# Patient Record
Sex: Female | Born: 1970 | Race: White | Hispanic: No | Marital: Married | Smoking: Former smoker
Health system: Southern US, Community
[De-identification: ages and names within clinical notes are randomized; demographics above are authoritative.]

## PROBLEM LIST (undated history)

## (undated) DIAGNOSIS — D649 Anemia, unspecified: Secondary | ICD-10-CM

## (undated) DIAGNOSIS — J40 Bronchitis, not specified as acute or chronic: Secondary | ICD-10-CM

## (undated) DIAGNOSIS — E039 Hypothyroidism, unspecified: Secondary | ICD-10-CM

## (undated) DIAGNOSIS — K8020 Calculus of gallbladder without cholecystitis without obstruction: Secondary | ICD-10-CM

## (undated) DIAGNOSIS — F32A Depression, unspecified: Secondary | ICD-10-CM

## (undated) DIAGNOSIS — K219 Gastro-esophageal reflux disease without esophagitis: Secondary | ICD-10-CM

## (undated) DIAGNOSIS — F329 Major depressive disorder, single episode, unspecified: Secondary | ICD-10-CM

## (undated) DIAGNOSIS — IMO0002 Reserved for concepts with insufficient information to code with codable children: Secondary | ICD-10-CM

## (undated) DIAGNOSIS — B009 Herpesviral infection, unspecified: Secondary | ICD-10-CM

## (undated) DIAGNOSIS — Z87891 Personal history of nicotine dependence: Secondary | ICD-10-CM

## (undated) DIAGNOSIS — D249 Benign neoplasm of unspecified breast: Secondary | ICD-10-CM

## (undated) DIAGNOSIS — F419 Anxiety disorder, unspecified: Secondary | ICD-10-CM

## (undated) HISTORY — DX: Anxiety disorder, unspecified: F41.9

## (undated) HISTORY — DX: Herpesviral infection, unspecified: B00.9

## (undated) HISTORY — DX: Reserved for concepts with insufficient information to code with codable children: IMO0002

## (undated) HISTORY — PX: BREAST SURGERY: SHX581

## (undated) HISTORY — DX: Depression, unspecified: F32.A

## (undated) HISTORY — DX: Calculus of gallbladder without cholecystitis without obstruction: K80.20

## (undated) HISTORY — DX: Personal history of nicotine dependence: Z87.891

## (undated) HISTORY — DX: Major depressive disorder, single episode, unspecified: F32.9

## (undated) HISTORY — PX: CHOLECYSTECTOMY: SHX55

## (undated) HISTORY — PX: DILATION AND CURETTAGE OF UTERUS: SHX78

## (undated) HISTORY — DX: Hypothyroidism, unspecified: E03.9

## (undated) HISTORY — DX: Benign neoplasm of unspecified breast: D24.9

## (undated) HISTORY — PX: INTRAUTERINE DEVICE (IUD) INSERTION: SHX5877

## (undated) HISTORY — DX: Bronchitis, not specified as acute or chronic: J40

---

## 1992-08-22 HISTORY — PX: PILONIDAL CYST EXCISION: SHX744

## 1994-08-22 HISTORY — PX: DILATION AND CURETTAGE OF UTERUS: SHX78

## 1998-02-25 ENCOUNTER — Other Ambulatory Visit: Admission: RE | Admit: 1998-02-25 | Discharge: 1998-02-25 | Payer: Self-pay | Admitting: Gynecology

## 1999-04-12 ENCOUNTER — Other Ambulatory Visit: Admission: RE | Admit: 1999-04-12 | Discharge: 1999-04-12 | Payer: Self-pay | Admitting: Gynecology

## 1999-10-26 ENCOUNTER — Other Ambulatory Visit: Admission: RE | Admit: 1999-10-26 | Discharge: 1999-10-26 | Payer: Self-pay | Admitting: Gynecology

## 2000-01-31 ENCOUNTER — Encounter (INDEPENDENT_AMBULATORY_CARE_PROVIDER_SITE_OTHER): Payer: Self-pay

## 2000-01-31 ENCOUNTER — Inpatient Hospital Stay (HOSPITAL_COMMUNITY): Admission: AD | Admit: 2000-01-31 | Discharge: 2000-02-01 | Payer: Self-pay | Admitting: Gynecology

## 2001-05-08 ENCOUNTER — Other Ambulatory Visit: Admission: RE | Admit: 2001-05-08 | Discharge: 2001-05-08 | Payer: Self-pay | Admitting: Gynecology

## 2001-08-06 ENCOUNTER — Other Ambulatory Visit: Admission: RE | Admit: 2001-08-06 | Discharge: 2001-08-06 | Payer: Self-pay | Admitting: Gynecology

## 2001-12-09 ENCOUNTER — Inpatient Hospital Stay (HOSPITAL_COMMUNITY): Admission: AD | Admit: 2001-12-09 | Discharge: 2001-12-09 | Payer: Self-pay | Admitting: Gynecology

## 2002-08-27 ENCOUNTER — Other Ambulatory Visit: Admission: RE | Admit: 2002-08-27 | Discharge: 2002-08-27 | Payer: Self-pay | Admitting: Gynecology

## 2003-01-31 ENCOUNTER — Inpatient Hospital Stay (HOSPITAL_COMMUNITY): Admission: AD | Admit: 2003-01-31 | Discharge: 2003-01-31 | Payer: Self-pay | Admitting: Gynecology

## 2003-03-01 ENCOUNTER — Inpatient Hospital Stay (HOSPITAL_COMMUNITY): Admission: AD | Admit: 2003-03-01 | Discharge: 2003-03-03 | Payer: Self-pay | Admitting: Gynecology

## 2003-03-02 ENCOUNTER — Encounter (INDEPENDENT_AMBULATORY_CARE_PROVIDER_SITE_OTHER): Payer: Self-pay | Admitting: Specialist

## 2003-04-07 ENCOUNTER — Other Ambulatory Visit: Admission: RE | Admit: 2003-04-07 | Discharge: 2003-04-07 | Payer: Self-pay | Admitting: Gynecology

## 2003-05-13 ENCOUNTER — Other Ambulatory Visit: Admission: RE | Admit: 2003-05-13 | Discharge: 2003-05-13 | Payer: Self-pay | Admitting: Pediatrics

## 2003-08-23 DIAGNOSIS — E039 Hypothyroidism, unspecified: Secondary | ICD-10-CM

## 2003-08-23 HISTORY — DX: Hypothyroidism, unspecified: E03.9

## 2003-12-05 HISTORY — PX: OTHER SURGICAL HISTORY: SHX169

## 2004-04-16 ENCOUNTER — Other Ambulatory Visit: Admission: RE | Admit: 2004-04-16 | Discharge: 2004-04-16 | Payer: Self-pay | Admitting: Gynecology

## 2005-08-22 HISTORY — PX: HYSTEROSCOPY: SHX211

## 2005-09-05 ENCOUNTER — Other Ambulatory Visit: Admission: RE | Admit: 2005-09-05 | Discharge: 2005-09-05 | Payer: Self-pay | Admitting: Gynecology

## 2006-04-02 ENCOUNTER — Emergency Department: Payer: Self-pay | Admitting: Emergency Medicine

## 2006-09-11 ENCOUNTER — Other Ambulatory Visit: Admission: RE | Admit: 2006-09-11 | Discharge: 2006-09-11 | Payer: Self-pay | Admitting: Gynecology

## 2007-11-02 ENCOUNTER — Other Ambulatory Visit: Admission: RE | Admit: 2007-11-02 | Discharge: 2007-11-02 | Payer: Self-pay | Admitting: Gynecology

## 2008-01-17 ENCOUNTER — Ambulatory Visit: Payer: Self-pay | Admitting: General Surgery

## 2008-01-24 ENCOUNTER — Ambulatory Visit: Payer: Self-pay | Admitting: General Surgery

## 2009-01-09 ENCOUNTER — Other Ambulatory Visit: Admission: RE | Admit: 2009-01-09 | Discharge: 2009-01-09 | Payer: Self-pay | Admitting: Gynecology

## 2009-01-09 ENCOUNTER — Ambulatory Visit: Payer: Self-pay | Admitting: Gynecology

## 2009-01-09 ENCOUNTER — Encounter: Payer: Self-pay | Admitting: Gynecology

## 2009-08-22 HISTORY — PX: BREAST EXCISIONAL BIOPSY: SUR124

## 2010-01-15 ENCOUNTER — Ambulatory Visit: Payer: Self-pay | Admitting: Gynecology

## 2010-01-15 ENCOUNTER — Other Ambulatory Visit: Admission: RE | Admit: 2010-01-15 | Discharge: 2010-01-15 | Payer: Self-pay | Admitting: Gynecology

## 2011-01-07 NOTE — H&P (Signed)
NAME:  Amy Peters, Amy Peters                       ACCOUNT NO.:  [id number]   MEDICAL RECORD NO.:  [id number]                   PATIENT TYPE:  INP   LOCATION:  9165                                 FACILITY:  [facility]   PHYSICIAN:  Juan H. Lily Peer, M.D.             DATE OF BIRTH:  07-08-1971   DATE OF ADMISSION:  03/01/2003  DATE OF DISCHARGE:                                HISTORY & PHYSICAL   CHIEF COMPLAINT:  Preterm premature rupture of membranes.   HISTORY:  The patient is a 40 year old gravida 4 para 1 AB 2, currently 19  and five-sevenths weeks gestation with an estimated date of confinement of  April 01, 2003.  The patient was seen at approximately 0330 this morning  after she came back from the bathroom and laid in bed then she had a gush of  fluid which was clear.  She also had some mild contractions, presented to  [facility], and was found to have contractions every three to five  minutes apart with a reassuring fetal heart rate tracing and she was  approximately 4 cm dilated, 90% effaced, and -2 station.  Her vital signs on  admission were blood pressure 126/76, temperature 98.3, pulse 72,  respirations 14.  Prenatal course significant for the fact that early in  this pregnancy on screening ultrasound there appeared to be a velamentous-  type placenta and the patient subsequently had follow-up ultrasound at  [facility] and did not feel that there would be any contraindication  of vaginal delivery, was very mild and away from the cervical os, and that  she could potentially deliver vaginally.  She had a history of an  intrauterine fetal demise at 23 weeks in the past.  She had a negative  workup and has been followed with weekly stress tests and ultrasounds for  antepartum testing.  In the third trimester she began to experience preterm  labor and had been taking terbutaline on a p.r.n. basis because of the  infrequent nature of her contractions.  She stated  yesterday in the  afternoon was the last dose of p.o. terbutaline that she took.   PAST MEDICAL HISTORY:  In 1996 she had a spontaneous AB of eight to ten  weeks gestation.  In 1998 a normal spontaneous vaginal delivery, 12-hour  labor, 8 pounds 9 ounces at 39 weeks.  In 2001 a fetal demise and delivered  vaginally at [redacted] weeks gestation.  The patient had a GBS culture back in June  2004 which had been negative.  She had a history of D&C in 1996, pilonidal  cyst in 1994.   REVIEW OF SYSTEMS:  See [place] form.   PHYSICAL EXAMINATION:  GENERAL:  Well-developed, well-nourished female.  HEENT:  Unremarkable.  NECK:  Supple, trachea midline.  No carotid bruits, no thyromegaly.  LUNGS:  Clear to auscultation without rhonchi or wheezes.  HEART:  Regular rate and rhythm.  No  murmurs or gallops.  BREAST:  Not done.  ABDOMEN:  Gravid uterus.  Fundal height approximately 35 cm, vertex  presentation by Thayer Ohm maneuver.  PELVIC:  Cervix now 7 cm, 90% effaced, -1 to 0 station.  Clear amniotic  fluid.  No abnormality palpated on digital exam.  EXTREMITIES:  DTR 1+, trace edema.   PRENATAL LABORATORY DATA:  O positive blood type, negative antibody screen.  VDRL was nonreactive.  Rubella immune.  Hepatitis B surface antigen and HIV  were negative.  Alpha-fetoprotein was normal.  Diabetes screen was normal.  Her admission hemoglobin and hematocrit were 11.7 and 35.4 respectively with  a platelet count of 157,000.   ASSESSMENT:  A 40 year old gravida 4 para 1 AB 2 at 35-and-a-half weeks  estimated gestational age with preterm premature rupture of membranes.  Early pregnancy ultrasound had demonstrated questionable velamentous cord  insertion.  Follow-up at [facility] did not recommend any  contraindication of vaginal delivery.  It appeared that the velamentous cord  insertion was away from the internal cervical os.  She had clear rupture of  membranes approximately 0330 this a.m.  Scalp  electrode was placed but will  hold off on putting an IUPC due to concerns of the presentation.  The  patient has had a reassuring fetal heart rate tracing, had an epidural  placed, and her contractions were every five to eight minutes apart.  Will  augment with Pitocin.  Fetal heart rate tracing is reassuring.  Anticipate a  vaginal delivery shortly.  Due to the fact that she is 35-and-a-half weeks  estimated gestational age despite having a negative GBS culture last month  she will receive Pen-G for prophylaxis.   PLAN:  As per assessment above.                                               Juan H. Lily Peer, M.D.    [facility]  D:  03/01/2003  T:  03/01/2003  Job:  [id number]

## 2011-01-07 NOTE — Discharge Summary (Signed)
   NAME:  Amy Peters, Amy Peters                       ACCOUNT NO.:  [id number]   MEDICAL RECORD NO.:  [id number]                   PATIENT TYPE:  INP   LOCATION:  9109                                 FACILITY:  [facility]   PHYSICIAN:  Juan H. Lily Peer, M.D.             DATE OF BIRTH:  1970-10-19   DATE OF ADMISSION:  03/01/2003  DATE OF DISCHARGE:  03/03/2003                                 DISCHARGE SUMMARY   DISCHARGE DIAGNOSES:  Intrauterine pregnancy at 35-5/7 weeks.   PROCEDURE:  Vacuum assisted delivery with a second degree perineal  laceration;  viable infant.   HISTORY OF PRESENT ILLNESS:  This is a 40 year old, gravida 4, para 1, AB2  currently 35-5/7 weeks with [facility] April 01, 2003, with LMP June 19, 2002.  The patient presented with a complaint of a gush of fluid that was clear,  had some contractions, and was found to have contractions every 3-5  minutes  with reassuring fetal heart tones.  She was 4 cm dilated, 90%, -2 station.   During her pregnancy she was found to have a velamentous type insertion of  the cord and did have followup ultrasounds at [facility] and they felt  there would be no contraindications for a vaginal delivery.   LABORATORY DATA:  Blood type is O+, negative antibody, VDRL nonreactive,  Rubella immune, hepatitis B negative, HIV nonreactive.   HOSPITAL COURSE:  The patient presented on March 01, 2003, in labor.  She  progressed to complete.  She had a vacuum assisted delivery with a second  degree perineal laceration.  Delivered a viable female with APGARS 8/9 with  a birth weight of 7 pounds 9 ounces.  Postpartum she remained afebrile,  voiding, and did well.  She was discharged home on her second postpartum day  in satisfactory condition.   DISCHARGE LABORATORY DATA:  White count 8.6, hemoglobin 10.5, hematocrit  31.4, platelets 136,000.   DISPOSITION:  She was discharged to home with instructions to follow up in  six weeks or as needed.   Continue prenatal vitamins and iron.  Motrin as  needed for pain.  GGA discharge booklet.     Davonna Belling. Young, N.P.                      Lars Mage H. Lily Peer, M.D.    [facility]  D:  03/21/2003  T:  03/21/2003  Job:  [id number]

## 2011-02-18 ENCOUNTER — Encounter (INDEPENDENT_AMBULATORY_CARE_PROVIDER_SITE_OTHER): Payer: BC Managed Care – PPO | Admitting: Gynecology

## 2011-02-18 ENCOUNTER — Other Ambulatory Visit (HOSPITAL_COMMUNITY)
Admission: RE | Admit: 2011-02-18 | Discharge: 2011-02-18 | Disposition: A | Discharge status: Home / Self Care | Payer: BC Managed Care – PPO | Source: Ambulatory Visit | Attending: Gynecology | Admitting: Gynecology

## 2011-02-18 ENCOUNTER — Other Ambulatory Visit: Payer: Self-pay | Admitting: Gynecology

## 2011-02-18 DIAGNOSIS — E039 Hypothyroidism, unspecified: Secondary | ICD-10-CM

## 2011-02-18 DIAGNOSIS — Z01419 Encounter for gynecological examination (general) (routine) without abnormal findings: Secondary | ICD-10-CM

## 2011-02-18 DIAGNOSIS — Z13220 Encounter for screening for lipoid disorders: Secondary | ICD-10-CM

## 2011-02-18 DIAGNOSIS — R82998 Other abnormal findings in urine: Secondary | ICD-10-CM

## 2011-02-18 DIAGNOSIS — Z124 Encounter for screening for malignant neoplasm of cervix: Secondary | ICD-10-CM | POA: Insufficient documentation

## 2011-02-18 DIAGNOSIS — Z833 Family history of diabetes mellitus: Secondary | ICD-10-CM

## 2011-05-30 ENCOUNTER — Emergency Department: Payer: Self-pay | Admitting: Emergency Medicine

## 2011-11-08 ENCOUNTER — Telehealth: Payer: Self-pay | Admitting: *Deleted

## 2011-11-08 ENCOUNTER — Other Ambulatory Visit: Payer: Self-pay | Admitting: *Deleted

## 2011-11-08 DIAGNOSIS — Z3049 Encounter for surveillance of other contraceptives: Secondary | ICD-10-CM

## 2011-11-08 MED ORDER — LEVONORGESTREL 20 MCG/24HR IU IUD: INTRAUTERINE_SYSTEM | Freq: Once | INTRAUTERINE | Status: DC

## 2011-11-08 NOTE — Telephone Encounter (Signed)
Message copied by Libby Maw on Tue Nov 08, 2011  9:11 AM ------      Message from: Carole Civil R      Created: Mon Nov 07, 2011 12:37 PM       Arthelia Callicott-This patient needs her Mirena IUD replaced. I made an appt for her for 11/25/11. She has [employer] primary and [employer] 2nd. Copies of both cards are in the system. You can call her with the information using the highlighted number in the system. I did verify it with her at the time of the call. Thanks, Toniann Fail

## 2011-11-08 NOTE — Telephone Encounter (Signed)
Patient informed will have a $20 copay for Mirena remove/insert.

## 2011-11-21 HISTORY — PX: INTRAUTERINE DEVICE INSERTION: SHX323

## 2011-11-25 ENCOUNTER — Ambulatory Visit: Payer: BC Managed Care – PPO | Admitting: Gynecology

## 2011-11-28 ENCOUNTER — Ambulatory Visit: Payer: BC Managed Care – PPO | Admitting: Gynecology

## 2011-12-09 ENCOUNTER — Ambulatory Visit (INDEPENDENT_AMBULATORY_CARE_PROVIDER_SITE_OTHER): Payer: BC Managed Care – PPO | Admitting: Gynecology

## 2011-12-09 ENCOUNTER — Other Ambulatory Visit: Payer: Self-pay | Admitting: Gynecology

## 2011-12-09 ENCOUNTER — Encounter: Payer: Self-pay | Admitting: Gynecology

## 2011-12-09 DIAGNOSIS — Z30430 Encounter for insertion of intrauterine contraceptive device: Secondary | ICD-10-CM

## 2011-12-09 DIAGNOSIS — Z3049 Encounter for surveillance of other contraceptives: Secondary | ICD-10-CM

## 2011-12-09 DIAGNOSIS — Z30433 Encounter for removal and reinsertion of intrauterine contraceptive device: Secondary | ICD-10-CM

## 2011-12-09 NOTE — Patient Instructions (Signed)
Intrauterine Device Information An intrauterine device (IUD) is inserted into your uterus and prevents pregnancy. There are 2 types of IUDs available:  Copper IUD. This type of IUD is wrapped in copper wire and is placed inside the uterus. Copper makes the uterus and fallopian tubes produce a fluid that kills sperm. The copper IUD can stay in place for 10 years.   Hormone IUD. This type of IUD contains the hormone progestin (synthetic progesterone). The hormone thickens the cervical mucus and prevents sperm from entering the uterus, and it also thins the uterine lining to prevent implantation of a fertilized egg. The hormone can weaken or kill the sperm that get into the uterus. The hormone IUD can stay in place for 5 years.  Your caregiver will make sure you are a good candidate for a contraceptive IUD. Discuss with your caregiver the possible side effects. ADVANTAGES  It is highly effective, reversible, long-acting, and low maintenance.   There are no estrogen-related side effects.   An IUD can be used when breastfeeding.   It is not associated with weight gain.   It works immediately after insertion.   The copper IUD does not interfere with your female hormones.   The progesterone IUD can make heavy menstrual periods lighter.   The progesterone IUD can be used for 5 years.   The copper IUD can be used for 10 years.  DISADVANTAGES  The progesterone IUD can be associated with irregular bleeding patterns.   The copper IUD can make your menstrual flow heavier and more painful.   You may experience cramping and vaginal bleeding after insertion.  Document Released: 07/12/2004 Document Revised: 07/28/2011 Document Reviewed: 12/11/2010 [employer] Patient Information 2012 [employer]. 

## 2011-12-09 NOTE — Progress Notes (Signed)
A patient is a 41 year old gravida 4 para 2 Ab2 who presented to the office today to remove her overdue Mirena IUD that was placed in March of 2008 and to replace it with a new one.  Patient had received literature information on the Mirena IUD. Consent form was signed. Patient fully aware that this form of contraception is 99% effective and is good for 5 years.  Exam: Abdomen soft nontender no rebound or guarding Pelvic: Bartholin urethra Skene glands Vagina: No lesion discharge Cervix: IUD string seen Uterus: Anteverted normal size shape and consistency Adnexa: No palpable masses or tenderness Rectal exam: Not done  Procedure note: The cervix was cleansed with Betadine solution. A Bozeman clamp was utilized to retrieve the IUD string and discarded the IUD. A single-tooth tenaculum was placed on the anterior cervical lip and the uterus was then sounded to 7 cm. The Mirena IUD was inserted in a sterile fashion after the appropriate measurement had been undertaken and the string was cut. The single-tooth tenaculum was removed. A small area that was oozing from the tenaculum was contained with silver nitrate. The patient was discharged home to return back to the office in one month for followup as well as her due annual exam.

## 2012-01-06 ENCOUNTER — Encounter: Payer: Self-pay | Admitting: Gynecology

## 2012-01-06 ENCOUNTER — Ambulatory Visit (INDEPENDENT_AMBULATORY_CARE_PROVIDER_SITE_OTHER): Payer: BC Managed Care – PPO | Admitting: Gynecology

## 2012-01-06 VITALS — BP 126/80 | Ht 66.0 in | Wt 191.0 lb

## 2012-01-06 DIAGNOSIS — E039 Hypothyroidism, unspecified: Secondary | ICD-10-CM

## 2012-01-06 DIAGNOSIS — Z01419 Encounter for gynecological examination (general) (routine) without abnormal findings: Secondary | ICD-10-CM

## 2012-01-06 DIAGNOSIS — R635 Abnormal weight gain: Secondary | ICD-10-CM

## 2012-01-06 LAB — CBC WITH DIFFERENTIAL/PLATELET
Basophils Absolute: 0.1 10*3/uL (ref 0.0–0.1)
Basophils Relative: 1 % (ref 0–1)
Eosinophils Relative: 5 % (ref 0–5)
Lymphocytes Relative: 23 % (ref 12–46)
MCHC: 32.7 g/dL (ref 30.0–36.0)
Neutro Abs: 4.7 10*3/uL (ref 1.7–7.7)
Platelets: 198 10*3/uL (ref 150–400)
RDW: 13.1 % (ref 11.5–15.5)
WBC: 7.3 10*3/uL (ref 4.0–10.5)

## 2012-01-06 LAB — GLUCOSE, RANDOM: Glucose, Bld: 88 mg/dL (ref 70–99)

## 2012-01-06 LAB — TSH: TSH: 3.205 u[IU]/mL (ref 0.350–4.500)

## 2012-01-06 NOTE — Patient Instructions (Signed)
Preventive Care for Adults, Female A healthy lifestyle and preventive care can promote health and wellness. Preventive health guidelines for women include the following key practices.  A routine yearly physical is a good way to check with your caregiver about your health and preventive screening. It is a chance to share any concerns and updates on your health, and to receive a thorough exam.   Visit your dentist for a routine exam and preventive care every 6 months. Brush your teeth twice a day and floss once a day. Good oral hygiene prevents tooth decay and gum disease.   The frequency of eye exams is based on your age, health, family medical history, use of contact lenses, and other factors. Follow your caregiver's recommendations for frequency of eye exams.   Eat a healthy diet. Foods like vegetables, fruits, whole grains, low-fat dairy products, and lean protein foods contain the nutrients you need without too many calories. Decrease your intake of foods high in solid fats, added sugars, and salt. Eat the right amount of calories for you.Get information about a proper diet from your caregiver, if necessary.   Regular physical exercise is one of the most important things you can do for your health. Most adults should get at least 150 minutes of moderate-intensity exercise (any activity that increases your heart rate and causes you to sweat) each week. In addition, most adults need muscle-strengthening exercises on 2 or more days a week.   Maintain a healthy weight. The body mass index (BMI) is a screening tool to identify possible weight problems. It provides an estimate of body fat based on height and weight. Your caregiver can help determine your BMI, and can help you achieve or maintain a healthy weight.For adults 20 years and older:   A BMI below 18.5 is considered underweight.   A BMI of 18.5 to 24.9 is normal.   A BMI of 25 to 29.9 is considered overweight.   A BMI of 30 and above is  considered obese.   Maintain normal blood lipids and cholesterol levels by exercising and minimizing your intake of saturated fat. Eat a balanced diet with plenty of fruit and vegetables. Blood tests for lipids and cholesterol should begin at age 20 and be repeated every 5 years. If your lipid or cholesterol levels are high, you are over 50, or you are at high risk for heart disease, you may need your cholesterol levels checked more frequently.Ongoing high lipid and cholesterol levels should be treated with medicines if diet and exercise are not effective.   If you smoke, find out from your caregiver how to quit. If you do not use tobacco, do not start.   If you are pregnant, do not drink alcohol. If you are breastfeeding, be very cautious about drinking alcohol. If you are not pregnant and choose to drink alcohol, do not exceed 1 drink per day. One drink is considered to be 12 ounces (355 mL) of beer, 5 ounces (148 mL) of wine, or 1.5 ounces (44 mL) of liquor.   Avoid use of street drugs. Do not share needles with anyone. Ask for help if you need support or instructions about stopping the use of drugs.   High blood pressure causes heart disease and increases the risk of stroke. Your blood pressure should be checked at least every 1 to 2 years. Ongoing high blood pressure should be treated with medicines if weight loss and exercise are not effective.   If you are 55 to 40   years old, ask your caregiver if you should take aspirin to prevent strokes.   Diabetes screening involves taking a blood sample to check your fasting blood sugar level. This should be done once every 3 years, after age 45, if you are within normal weight and without risk factors for diabetes. Testing should be considered at a younger age or be carried out more frequently if you are overweight and have at least 1 risk factor for diabetes.   Breast cancer screening is essential preventive care for women. You should practice "breast  self-awareness." This means understanding the normal appearance and feel of your breasts and may include breast self-examination. Any changes detected, no matter how small, should be reported to a caregiver. Women in their 20s and 30s should have a clinical breast exam (CBE) by a caregiver as part of a regular health exam every 1 to 3 years. After age 40, women should have a CBE every year. Starting at age 40, women should consider having a mammography (breast X-ray test) every year. Women who have a family history of breast cancer should talk to their caregiver about genetic screening. Women at a high risk of breast cancer should talk to their caregivers about having magnetic resonance imaging (MRI) and a mammography every year.   The Pap test is a screening test for cervical cancer. A Pap test can show cell changes on the cervix that might become cervical cancer if left untreated. A Pap test is a procedure in which cells are obtained and examined from the lower end of the uterus (cervix).   Women should have a Pap test starting at age 21.   Between ages 21 and 29, Pap tests should be repeated every 2 years.   Beginning at age 30, you should have a Pap test every 3 years as long as the past 3 Pap tests have been normal.   Some women have medical problems that increase the chance of getting cervical cancer. Talk to your caregiver about these problems. It is especially important to talk to your caregiver if a new problem develops soon after your last Pap test. In these cases, your caregiver may recommend more frequent screening and Pap tests.   The above recommendations are the same for women who have or have not gotten the vaccine for human papillomavirus (HPV).   If you had a hysterectomy for a problem that was not cancer or a condition that could lead to cancer, then you no longer need Pap tests. Even if you no longer need a Pap test, a regular exam is a good idea to make sure no other problems are  starting.   If you are between ages 65 and 70, and you have had normal Pap tests going back 10 years, you no longer need Pap tests. Even if you no longer need a Pap test, a regular exam is a good idea to make sure no other problems are starting.   If you have had past treatment for cervical cancer or a condition that could lead to cancer, you need Pap tests and screening for cancer for at least 20 years after your treatment.   If Pap tests have been discontinued, risk factors (such as a new sexual partner) need to be reassessed to determine if screening should be resumed.   The HPV test is an additional test that may be used for cervical cancer screening. The HPV test looks for the virus that can cause the cell changes on the cervix.   The cells collected during the Pap test can be tested for HPV. The HPV test could be used to screen women aged 30 years and older, and should be used in women of any age who have unclear Pap test results. After the age of 30, women should have HPV testing at the same frequency as a Pap test.   Colorectal cancer can be detected and often prevented. Most routine colorectal cancer screening begins at the age of 50 and continues through age 75. However, your caregiver may recommend screening at an earlier age if you have risk factors for colon cancer. On a yearly basis, your caregiver may provide home test kits to check for hidden blood in the stool. Use of a small camera at the end of a tube, to directly examine the colon (sigmoidoscopy or colonoscopy), can detect the earliest forms of colorectal cancer. Talk to your caregiver about this at age 50, when routine screening begins. Direct examination of the colon should be repeated every 5 to 10 years through age 75, unless early forms of pre-cancerous polyps or small growths are found.   Hepatitis C blood testing is recommended for all people born from 1945 through 1965 and any individual with known risks for hepatitis C.    Practice safe sex. Use condoms and avoid high-risk sexual practices to reduce the spread of sexually transmitted infections (STIs). STIs include gonorrhea, chlamydia, syphilis, trichomonas, herpes, HPV, and human immunodeficiency virus (HIV). Herpes, HIV, and HPV are viral illnesses that have no cure. They can result in disability, cancer, and death. Sexually active women aged 25 and younger should be checked for chlamydia. Older women with new or multiple partners should also be tested for chlamydia. Testing for other STIs is recommended if you are sexually active and at increased risk.   Osteoporosis is a disease in which the bones lose minerals and strength with aging. This can result in serious bone fractures. The risk of osteoporosis can be identified using a bone density scan. Women ages 65 and over and women at risk for fractures or osteoporosis should discuss screening with their caregivers. Ask your caregiver whether you should take a calcium supplement or vitamin D to reduce the rate of osteoporosis.   Menopause can be associated with physical symptoms and risks. Hormone replacement therapy is available to decrease symptoms and risks. You should talk to your caregiver about whether hormone replacement therapy is right for you.   Use sunscreen with sun protection factor (SPF) of 30 or more. Apply sunscreen liberally and repeatedly throughout the day. You should seek shade when your shadow is shorter than you. Protect yourself by wearing long sleeves, pants, a wide-brimmed hat, and sunglasses year round, whenever you are outdoors.   Once a month, do a whole body skin exam, using a mirror to look at the skin on your back. Notify your caregiver of new moles, moles that have irregular borders, moles that are larger than a pencil eraser, or moles that have changed in shape or color.   Stay current with required immunizations.   Influenza. You need a dose every fall (or winter). The composition of  the flu vaccine changes each year, so being vaccinated once is not enough.   Pneumococcal polysaccharide. You need 1 to 2 doses if you smoke cigarettes or if you have certain chronic medical conditions. You need 1 dose at age 65 (or older) if you have never been vaccinated.   Tetanus, diphtheria, pertussis (Tdap, Td). Get 1 dose of   Tdap vaccine if you are younger than age 65, are over 65 and have contact with an infant, are a healthcare worker, are pregnant, or simply want to be protected from whooping cough. After that, you need a Td booster dose every 10 years. Consult your caregiver if you have not had at least 3 tetanus and diphtheria-containing shots sometime in your life or have a deep or dirty wound.   HPV. You need this vaccine if you are a woman age 26 or younger. The vaccine is given in 3 doses over 6 months.   Measles, mumps, rubella (MMR). You need at least 1 dose of MMR if you were born in 1957 or later. You may also need a second dose.   Meningococcal. If you are age 19 to 21 and a first-year college student living in a residence hall, or have one of several medical conditions, you need to get vaccinated against meningococcal disease. You may also need additional booster doses.   Zoster (shingles). If you are age 60 or older, you should get this vaccine.   Varicella (chickenpox). If you have never had chickenpox or you were vaccinated but received only 1 dose, talk to your caregiver to find out if you need this vaccine.   Hepatitis A. You need this vaccine if you have a specific risk factor for hepatitis A virus infection or you simply wish to be protected from this disease. The vaccine is usually given as 2 doses, 6 to 18 months apart.   Hepatitis B. You need this vaccine if you have a specific risk factor for hepatitis B virus infection or you simply wish to be protected from this disease. The vaccine is given in 3 doses, usually over 6 months.  Preventive Services /  Frequency Ages 19 to 39  Blood pressure check.** / Every 1 to 2 years.   Lipid and cholesterol check.** / Every 5 years beginning at age 20.   Clinical breast exam.** / Every 3 years for women in their 20s and 30s.   Pap test.** / Every 2 years from ages 21 through 29. Every 3 years starting at age 30 through age 65 or 70 with a history of 3 consecutive normal Pap tests.   HPV screening.** / Every 3 years from ages 30 through ages 65 to 70 with a history of 3 consecutive normal Pap tests.   Hepatitis C blood test.** / For any individual with known risks for hepatitis C.   Skin self-exam. / Monthly.   Influenza immunization.** / Every year.   Pneumococcal polysaccharide immunization.** / 1 to 2 doses if you smoke cigarettes or if you have certain chronic medical conditions.   Tetanus, diphtheria, pertussis (Tdap, Td) immunization. / A one-time dose of Tdap vaccine. After that, you need a Td booster dose every 10 years.   HPV immunization. / 3 doses over 6 months, if you are 26 and younger.   Measles, mumps, rubella (MMR) immunization. / You need at least 1 dose of MMR if you were born in 1957 or later. You may also need a second dose.   Meningococcal immunization. / 1 dose if you are age 19 to 21 and a first-year college student living in a residence hall, or have one of several medical conditions, you need to get vaccinated against meningococcal disease. You may also need additional booster doses.   Varicella immunization.** / Consult your caregiver.   Hepatitis A immunization.** / Consult your caregiver. 2 doses, 6 to 18 months   apart.   Hepatitis B immunization.** / Consult your caregiver. 3 doses usually over 6 months.  Ages 40 to 64  Blood pressure check.** / Every 1 to 2 years.   Lipid and cholesterol check.** / Every 5 years beginning at age 20.   Clinical breast exam.** / Every year after age 40.   Mammogram.** / Every year beginning at age 40 and continuing for as  long as you are in good health. Consult with your caregiver.   Pap test.** / Every 3 years starting at age 30 through age 65 or 70 with a history of 3 consecutive normal Pap tests.   HPV screening.** / Every 3 years from ages 30 through ages 65 to 70 with a history of 3 consecutive normal Pap tests.   Fecal occult blood test (FOBT) of stool. / Every year beginning at age 50 and continuing until age 75. You may not need to do this test if you get a colonoscopy every 10 years.   Flexible sigmoidoscopy or colonoscopy.** / Every 5 years for a flexible sigmoidoscopy or every 10 years for a colonoscopy beginning at age 50 and continuing until age 75.   Hepatitis C blood test.** / For all people born from 1945 through 1965 and any individual with known risks for hepatitis C.   Skin self-exam. / Monthly.   Influenza immunization.** / Every year.   Pneumococcal polysaccharide immunization.** / 1 to 2 doses if you smoke cigarettes or if you have certain chronic medical conditions.   Tetanus, diphtheria, pertussis (Tdap, Td) immunization.** / A one-time dose of Tdap vaccine. After that, you need a Td booster dose every 10 years.   Measles, mumps, rubella (MMR) immunization. / You need at least 1 dose of MMR if you were born in 1957 or later. You may also need a second dose.   Varicella immunization.** / Consult your caregiver.   Meningococcal immunization.** / Consult your caregiver.   Hepatitis A immunization.** / Consult your caregiver. 2 doses, 6 to 18 months apart.   Hepatitis B immunization.** / Consult your caregiver. 3 doses, usually over 6 months.  Ages 65 and over  Blood pressure check.** / Every 1 to 2 years.   Lipid and cholesterol check.** / Every 5 years beginning at age 20.   Clinical breast exam.** / Every year after age 40.   Mammogram.** / Every year beginning at age 40 and continuing for as long as you are in good health. Consult with your caregiver.   Pap test.** /  Every 3 years starting at age 30 through age 65 or 70 with a 3 consecutive normal Pap tests. Testing can be stopped between 65 and 70 with 3 consecutive normal Pap tests and no abnormal Pap or HPV tests in the past 10 years.   HPV screening.** / Every 3 years from ages 30 through ages 65 or 70 with a history of 3 consecutive normal Pap tests. Testing can be stopped between 65 and 70 with 3 consecutive normal Pap tests and no abnormal Pap or HPV tests in the past 10 years.   Fecal occult blood test (FOBT) of stool. / Every year beginning at age 50 and continuing until age 75. You may not need to do this test if you get a colonoscopy every 10 years.   Flexible sigmoidoscopy or colonoscopy.** / Every 5 years for a flexible sigmoidoscopy or every 10 years for a colonoscopy beginning at age 50 and continuing until age 75.   Hepatitis   C blood test.** / For all people born from [birth year] through 1965 and any individual with known risks for hepatitis C.   Osteoporosis screening.** / A one-time screening for women ages 17 and over and women at risk for fractures or osteoporosis.   Skin self-exam. / Monthly.   Influenza immunization.** / Every year.   Pneumococcal polysaccharide immunization.** / 1 dose at age 29 (or older) if you have never been vaccinated.   Tetanus, diphtheria, pertussis (Tdap, Td) immunization. / A one-time dose of Tdap vaccine if you are over 65 and have contact with an infant, are a Research scientist (physical sciences), or simply want to be protected from whooping cough. After that, you need a Td booster dose every 10 years.   Varicella immunization.** / Consult your caregiver.   Meningococcal immunization.** / Consult your caregiver.   Hepatitis A immunization.** / Consult your caregiver. 2 doses, 6 to 18 months apart.   Hepatitis B immunization.** / Check with your caregiver. 3 doses, usually over 6 months.  ** Family history and personal history of risk and conditions may change your caregiver's  recommendations. Document Released: 10/04/2001 Document Revised: 07/28/2011 Document Reviewed: 01/03/2011 [facility] Patient Information 2012 [place], [place].  Breast Self-Exam A self breast exam may help you find changes or problems while they are still small. Do a breast self-exam:  Every month.   One week after your period (menstrual period).   On the first day of each month if you do not have periods anymore.  Look for any:  Change in breast color, size, or shape.   Dimples in your breast.   Changes in your nipples or skin.   Dry skin on your breasts or nipples.   Watery or bloody discharge from your nipples.   Feel for:  Lumps.   Thick, hard places.   Any other changes.  HOME CARE There are 3 ways to do the breast self-exam: In front of a mirror.  Lift your arms over your head and turn side to side.   Put your hands on your hips and lean down, then turn from side to side.   Bend forward and turn from side to side.  In the shower.  With soapy hands, check both breasts. Then check above and below your collarbone and your armpits.   Feel above and below your collarbone down to under your breast, and from the center of your chest to the outer edge of the armpit. Check for any lumps or hard spots.   Using the tips of your middle three fingers check your whole breast by pressing your hand over your breast in a circle or in an up and down motion.  Lying down.  Lie flat on your bed.   Put a small pillow under the breast you are going to check. On that same side, put your hand behind your head.   With your other hand, use the 3 middle fingers to feel the breast.   Move your fingers in a circle around the breast. Press firmly over all parts of the breast to feel for any lumps.  GET HELP RIGHT AWAY IF: You find any changes in your breasts so they can be checked. Document Released: 01/25/2008 Document Revised: 07/28/2011 Document Reviewed: 11/26/2008 [facility] Patient  Information 2012 [place], [place].  Exercise to Lose Weight Exercise and a healthy diet may help you lose weight. Your doctor may suggest specific exercises. EXERCISE IDEAS AND TIPS  Choose low-cost things you enjoy doing, such as walking, bicycling,  or exercising to workout videos.   Take stairs instead of the elevator.   Walk during your lunch break.   Park your car further away from work or school.   Go to a gym or an exercise class.   Start with 5 to 10 minutes of exercise each day. Build up to 30 minutes of exercise 4 to 6 days a week.   Wear shoes with good support and comfortable clothes.   Stretch before and after working out.   Work out until you breathe harder and your heart beats faster.   Drink extra water when you exercise.   Do not do so much that you hurt yourself, feel dizzy, or get very short of breath.  Exercises that burn about 150 calories:  Running 1  miles in 15 minutes.   Playing volleyball for 45 to 60 minutes.   Washing and waxing a car for 45 to 60 minutes.   Playing touch football for 45 minutes.   Walking 1  miles in 35 minutes.   Pushing a stroller 1  miles in 30 minutes.   Playing basketball for 30 minutes.   Raking leaves for 30 minutes.   Bicycling 5 miles in 30 minutes.   Walking 2 miles in 30 minutes.   Dancing for 30 minutes.   Shoveling snow for 15 minutes.   Swimming laps for 20 minutes.   Walking up stairs for 15 minutes.   Bicycling 4 miles in 15 minutes.   Gardening for 30 to 45 minutes.   Jumping rope for 15 minutes.   Washing windows or floors for 45 to 60 minutes.  Document Released: 09/10/2010 Document Revised: 04/20/2011 Document Reviewed: 09/10/2010 [facility] Patient Information 2012 [place], [place].

## 2012-01-06 NOTE — Progress Notes (Signed)
Amy Peters 06-12-1971 [id number]   History:    41 y.o.  for annual exam with no complaints today. She had the Mirena IUD changed for a new one in April of this year. Patient with prior history of hypothyroidism she's currently on Synthroid 112 mcg daily. Review of her record indicated that she was weighing 183 pounds up to 191 pounds now. Last Pap smear normal in 2012. Mother had breast cancer at the age of 71. Last mammogram July 2012. Patient frequently does her self breast examination. Due to patient's past history of hyperthyroidism treated by iodine-131 and subsequently developed hypothyroidism she had a baseline bone density study in 2012 which was normal.  Past medical history,surgical history, family history and social history were all reviewed and documented in the EPIC chart.  Gynecologic History Patient's last menstrual period was 03/08/2011. Contraception: IUD Last Pap: 2012. Results were: normal Last mammogram: 2012. Results were: normal  Obstetric History OB History    Grav Para Term Preterm Abortions TAB SAB Ect Mult Living   4 2 1 1 2  2   2      # Outc Date GA Lbr Len/2nd Wgt Sex Del Anes PTL Lv   1 TRM     M SVD  No Yes   2 PRE     M SVD  Yes Yes   3 SAB            4 SAB                ROS: A ROS was performed and pertinent positives and negatives are included in the history.  GENERAL: No fevers or chills. HEENT: No change in vision, no earache, sore throat or sinus congestion. NECK: No pain or stiffness. CARDIOVASCULAR: No chest pain or pressure. No palpitations. PULMONARY: No shortness of breath, cough or wheeze. GASTROINTESTINAL: No abdominal pain, nausea, vomiting or diarrhea, melena or bright red blood per rectum. GENITOURINARY: No urinary frequency, urgency, hesitancy or dysuria. MUSCULOSKELETAL: No joint or muscle pain, no back pain, no recent trauma. DERMATOLOGIC: No rash, no itching, no lesions. ENDOCRINE: No polyuria, polydipsia, no heat or cold intolerance.  No recent change in weight. HEMATOLOGICAL: No anemia or easy bruising or bleeding. NEUROLOGIC: No headache, seizures, numbness, tingling or weakness. PSYCHIATRIC: No depression, no loss of interest in normal activity or change in sleep pattern.     Exam: chaperone present  BP 126/80  Ht 5\' 6"  (1.676 m)  Wt 191 lb (86.637 kg)  BMI 30.83 kg/m2  LMP 03/08/2011  Body mass index is 30.83 kg/(m^2).  General appearance : Well developed well nourished female. No acute distress HEENT: Neck supple, trachea midline, no carotid bruits, no thyroidmegaly Lungs: Clear to auscultation, no rhonchi or wheezes, or rib retractions  Heart: Regular rate and rhythm, no murmurs or gallops Breast:Examined in sitting and supine position were symmetrical in appearance, no palpable masses or tenderness,  no skin retraction, no nipple inversion, no nipple discharge, no skin discoloration, no axillary or supraclavicular lymphadenopathy Abdomen: no palpable masses or tenderness, no rebound or guarding Extremities: no edema or skin discoloration or tenderness  Pelvic:  Bartholin, Urethra, Skene Glands: Within normal limits             Vagina: No gross lesions or discharge  Cervix: No gross lesions or discharge, IUD string seen  Uterus  anteverted, normal size, shape and consistency, non-tender and mobile  Adnexa  Without masses or tenderness  Anus and perineum  normal  Rectovaginal  normal sphincter tone without palpated masses or tenderness             Hemoccult not done     Assessment/Plan:  41 y.o. female for annual exam who was given literature formation on exercise and diet. Patient was given a requisition to schedule her mammogram. She was encouraged to continue to do her monthly self breast examination for which literature information was provided as well. The following labs were drawn today: Random blood sugar, TSH, CBC, cholesterol, and urinalysis. New Pap smear screening guidelines discussed she will  not need 1 for 2 more years. She was also reminded to take calcium and vitamin D for osteoporosis prevention.    Ok Edwards MD, 3:17 PM 01/06/2012

## 2012-01-07 LAB — URINALYSIS W MICROSCOPIC + REFLEX CULTURE
Bacteria, UA: NONE SEEN
Casts: NONE SEEN
Glucose, UA: NEGATIVE mg/dL
Hgb urine dipstick: NEGATIVE
Ketones, ur: NEGATIVE mg/dL
Leukocytes, UA: NEGATIVE
Protein, ur: NEGATIVE mg/dL
pH: 6 (ref 5.0–8.0)

## 2012-03-19 ENCOUNTER — Other Ambulatory Visit: Payer: Self-pay | Admitting: Gynecology

## 2012-06-01 ENCOUNTER — Encounter: Payer: Self-pay | Admitting: Gynecology

## 2012-06-06 ENCOUNTER — Encounter: Payer: Self-pay | Admitting: Gynecology

## 2012-06-06 ENCOUNTER — Telehealth: Payer: Self-pay | Admitting: Gynecology

## 2012-06-06 NOTE — Telephone Encounter (Signed)
Spoke with patient today. Her husband a call yesterday requesting copy of her mammogram that was done at [facility]. It appears that she has a solid lesion noted near her left nipple and they were recommending a stereotactic biopsy. Patient has already made arrangements with a general surgeon (appointment set for tomorrow) who she has seen before for breast cyst in the past. We have faxed her general surgeon in a copy of the report as well as mailed a copy of the report to the patient's home.

## 2012-07-13 ENCOUNTER — Ambulatory Visit: Payer: Self-pay | Admitting: General Surgery

## 2012-07-13 HISTORY — PX: BREAST EXCISIONAL BIOPSY: SUR124

## 2012-09-29 ENCOUNTER — Encounter: Payer: Self-pay | Admitting: General Surgery

## 2012-11-15 ENCOUNTER — Ambulatory Visit: Payer: Self-pay | Admitting: General Surgery

## 2013-01-28 ENCOUNTER — Encounter: Payer: Self-pay | Admitting: *Deleted

## 2013-03-25 ENCOUNTER — Other Ambulatory Visit: Payer: Self-pay | Admitting: Gynecology

## 2013-03-25 NOTE — Telephone Encounter (Signed)
Has CE scheduled 04/12/13.

## 2013-04-12 ENCOUNTER — Ambulatory Visit (INDEPENDENT_AMBULATORY_CARE_PROVIDER_SITE_OTHER): Payer: BC Managed Care – PPO | Admitting: Gynecology

## 2013-04-12 ENCOUNTER — Encounter: Payer: Self-pay | Admitting: Gynecology

## 2013-04-12 VITALS — BP 124/82 | Ht 66.0 in | Wt 181.0 lb

## 2013-04-12 DIAGNOSIS — Z01419 Encounter for gynecological examination (general) (routine) without abnormal findings: Secondary | ICD-10-CM

## 2013-04-12 DIAGNOSIS — Z803 Family history of malignant neoplasm of breast: Secondary | ICD-10-CM | POA: Insufficient documentation

## 2013-04-12 DIAGNOSIS — E039 Hypothyroidism, unspecified: Secondary | ICD-10-CM

## 2013-04-12 DIAGNOSIS — F17200 Nicotine dependence, unspecified, uncomplicated: Secondary | ICD-10-CM

## 2013-04-12 DIAGNOSIS — Z23 Encounter for immunization: Secondary | ICD-10-CM

## 2013-04-12 LAB — CHOLESTEROL, TOTAL: Cholesterol: 139 mg/dL (ref 0–200)

## 2013-04-12 LAB — CBC WITH DIFFERENTIAL/PLATELET
Basophils Relative: 0 % (ref 0–1)
Hemoglobin: 13.6 g/dL (ref 12.0–15.0)
Lymphs Abs: 1.9 10*3/uL (ref 0.7–4.0)
Monocytes Relative: 7 % (ref 3–12)
Neutro Abs: 5 10*3/uL (ref 1.7–7.7)
Neutrophils Relative %: 65 % (ref 43–77)
RBC: 4.32 MIL/uL (ref 3.87–5.11)

## 2013-04-12 LAB — HEMOGLOBIN A1C
Hgb A1c MFr Bld: 5.6 % (ref <5.7)
Mean Plasma Glucose: 114 mg/dL (ref <117)

## 2013-04-12 MED ORDER — LEVOTHYROXINE SODIUM 112 MCG PO TABS: 112.0000 ug | ORAL_TABLET | Freq: Every day | ORAL | Status: DC

## 2013-04-12 NOTE — Addendum Note (Signed)
Addended by: Bertram Savin A on: 04/12/2013 01:57 PM   Modules accepted: Orders

## 2013-04-12 NOTE — Patient Instructions (Addendum)
Tetanus, Diphtheria, Pertussis (Tdap) Vaccine What You Need to Know WHY GET VACCINATED? Tetanus, diphtheria and pertussis can be very serious diseases, even for adolescents and adults. Tdap vaccine can protect us from these diseases. TETANUS (Lockjaw) causes painful muscle tightening and stiffness, usually all over the body.  It can lead to tightening of muscles in the head and neck so you can't open your mouth, swallow, or sometimes even breathe. Tetanus kills about 1 out of 5 people who are infected. DIPHTHERIA can cause a thick coating to form in the back of the throat.  It can lead to breathing problems, paralysis, heart failure, and death. PERTUSSIS (Whooping Cough) causes severe coughing spells, which can cause difficulty breathing, vomiting and disturbed sleep.  It can also lead to weight loss, incontinence, and rib fractures. Up to 2 in 100 adolescents and 5 in 100 adults with pertussis are hospitalized or have complications, which could include pneumonia and death. These diseases are caused by bacteria. Diphtheria and pertussis are spread from person to person through coughing or sneezing. Tetanus enters the body through cuts, scratches, or wounds. Before vaccines, the [place] saw as many as 200,000 cases a year of diphtheria and pertussis, and hundreds of cases of tetanus. Since vaccination began, tetanus and diphtheria have dropped by about 99% and pertussis by about 80%. TDAP VACCINE Tdap vaccine can protect adolescents and adults from tetanus, diphtheria, and pertussis. One dose of Tdap is routinely given at age 11 or 12. People who did not get Tdap at that age should get it as soon as possible. Tdap is especially important for health care professionals and anyone having close contact with a baby younger than 12 months. Pregnant women should get a dose of Tdap during every pregnancy, to protect the newborn from pertussis. Infants are most at risk for severe, life-threatening  complications from pertussis. A similar vaccine, called Td, protects from tetanus and diphtheria, but not pertussis. A Td booster should be given every 10 years. Tdap may be given as one of these boosters if you have not already gotten a dose. Tdap may also be given after a severe cut or burn to prevent tetanus infection. Your doctor can give you more information. Tdap may safely be given at the same time as other vaccines. SOME PEOPLE SHOULD NOT GET THIS VACCINE  If you ever had a life-threatening allergic reaction after a dose of any tetanus, diphtheria, or pertussis containing vaccine, OR if you have a severe allergy to any part of this vaccine, you should not get Tdap. Tell your doctor if you have any severe allergies.  If you had a coma, or long or multiple seizures within 7 days after a childhood dose of DTP or DTaP, you should not get Tdap, unless a cause other than the vaccine was found. You can still get Td.  Talk to your doctor if you:  have epilepsy or another nervous system problem,  had severe pain or swelling after any vaccine containing diphtheria, tetanus or pertussis,  ever had Guillain-Barr Syndrome (GBS),  aren't feeling well on the day the shot is scheduled. RISKS OF A VACCINE REACTION With any medicine, including vaccines, there is a chance of side effects. These are usually mild and go away on their own, but serious reactions are also possible. Brief fainting spells can follow a vaccination, leading to injuries from falling. Sitting or lying down for about 15 minutes can help prevent these. Tell your doctor if you feel dizzy or light-headed, or   have vision changes or ringing in the ears. Mild problems following Tdap (Did not interfere with activities)  Pain where the shot was given (about 3 in 4 adolescents or 2 in 3 adults)  Redness or swelling where the shot was given (about 1 person in 5)  Mild fever of at least 100.93F (up to about 1 in 25 adolescents or 1 in  100 adults)  Headache (about 3 or 4 people in 10)  Tiredness (about 1 person in 3 or 4)  Nausea, vomiting, diarrhea, stomach ache (up to 1 in 4 adolescents or 1 in 10 adults)  Chills, body aches, sore joints, rash, swollen glands (uncommon) Moderate problems following Tdap (Interfered with activities, but did not require medical attention)  Pain where the shot was given (about 1 in 5 adolescents or 1 in 100 adults)  Redness or swelling where the shot was given (up to about 1 in 16 adolescents or 1 in 25 adults)  Fever over 102F (about 1 in 100 adolescents or 1 in 250 adults)  Headache (about 3 in 20 adolescents or 1 in 10 adults)  Nausea, vomiting, diarrhea, stomach ache (up to 1 or 3 people in 100)  Swelling of the entire arm where the shot was given (up to about 3 in 100). Severe problems following Tdap (Unable to perform usual activities, required medical attention)  Swelling, severe pain, bleeding and redness in the arm where the shot was given (rare). A severe allergic reaction could occur after any vaccine (estimated less than 1 in a million doses). WHAT IF THERE IS A SERIOUS REACTION? What should I look for?  Look for anything that concerns you, such as signs of a severe allergic reaction, very high fever, or behavior changes. Signs of a severe allergic reaction can include hives, swelling of the face and throat, difficulty breathing, a fast heartbeat, dizziness, and weakness. These would start a few minutes to a few hours after the vaccination. What should I do?  If you think it is a severe allergic reaction or other emergency that can't wait, call 9-1-1 or get the person to the nearest hospital. Otherwise, call your doctor.  Afterward, the reaction should be reported to the "Vaccine Adverse Event Reporting System" (VAERS). Your doctor might file this report, or you can do it yourself through the VAERS web site at www.vaers.LAgents.no, or by calling 1-(502) 195-1941. VAERS is  only for reporting reactions. They do not give medical advice.  THE NATIONAL VACCINE INJURY COMPENSATION PROGRAM The National Vaccine Injury Compensation Program (VICP) is a federal program that was created to compensate people who may have been injured by certain vaccines. Persons who believe they may have been injured by a vaccine can learn about the program and about filing a claim by calling 1-(620)195-4826 or visiting the VICP website at SpiritualWord.at. HOW CAN I LEARN MORE?  Ask your doctor.  Call your local or state health department.  Contact the Centers for Disease Control and Prevention (CDC):  Call 831-560-1678 or visit CDC's website at PicCapture.uy. CDC Tdap Vaccine VIS (12/29/11) Document Released: 02/07/2012 Document Revised: 05/02/2012 Document Reviewed: 02/07/2012 [employer] Patient Information 2014 [place], [place]. Varenicline oral tablets What is this medicine? VARENICLINE (var EN i kleen) is used to help people quit smoking. It can reduce the symptoms caused by stopping smoking. It is used with a patient support program recommended by your physician. This medicine may be used for other purposes; ask your health care provider or pharmacist if you have questions. What should  I tell my health care provider before I take this medicine? They need to know if you have any of these conditions: -bipolar disorder, depression, schizophrenia or other mental illness -heart disease -kidney disease -peripheral vascular disease -stroke -suicidal thoughts, plans, or attempt; a previous suicide attempt by you or a family member -an unusual or allergic reaction to varenicline, other medicines, foods, dyes, or preservatives -pregnant or trying to get pregnant -breast-feeding How should I use this medicine? You should set a date to stop smoking and tell your doctor. Start this medicine one week before the quit date. You can also start taking this medicine before  you choose a quit date, and then pick a quit date that is between 8 and 35 days of treatment with this medicine. Stick to your plan; ask about support groups or other ways to help you remain a 'quitter'. Take this medicine by mouth after eating. Take with a full glass of water. Follow the directions on the prescription label. Take your doses at regular intervals. Do not take your medicine more often than directed. A special MedGuide will be given to you by the pharmacist with each prescription and refill. Be sure to read this information carefully each time. Talk to your pediatrician regarding the use of this medicine in children. This medicine is not approved for use in children. Overdosage: If you think you have taken too much of this medicine contact a poison control center or emergency room at once. NOTE: This medicine is only for you. Do not share this medicine with others. What if I miss a dose? If you miss a dose, take it as soon as you can. If it is almost time for your next dose, take only that dose. Do not take double or extra doses. What may interact with this medicine? -insulin -other stop smoking aids -theophylline -warfarin This list may not describe all possible interactions. Give your health care provider a list of all the medicines, herbs, non-prescription drugs, or dietary supplements you use. Also tell them if you smoke, drink alcohol, or use illegal drugs. Some items may interact with your medicine. What should I watch for while using this medicine? Visit your doctor or health care professional for regular check ups. Ask for ongoing advice and encouragement from your doctor or healthcare professional, friends, and family to help you quit. If you smoke while on this medication, quit again Your mouth may get dry. Chewing sugarless gum or sucking hard candy, and drinking plenty of water may help. Contact your doctor if the problem does not go away or is severe. You may get drowsy or  dizzy. Do not drive, use machinery, or do anything that needs mental alertness until you know how this medicine affects you. Do not stand or sit up quickly, especially if you are an older patient. This reduces the risk of dizzy or fainting spells. The use of this medicine may increase the chance of suicidal thoughts or actions. Pay special attention to how you are responding while on this medicine. Any worsening of mood, or thoughts of suicide or dying should be reported to your health care professional right away. What side effects may I notice from receiving this medicine? Side effects that you should report to your doctor or health care professional as soon as possible: -allergic reactions like skin rash, itching or hives, swelling of the face, lips, tongue, or throat -breathing problems -changes in vision -chest pain or chest tightness -confusion, trouble speaking or understanding -fast, irregular  heartbeat -feeling faint or lightheaded, falls -fever -pain in legs when walking -problems with balance, talking, walking -ringing in ears -sudden numbness or weakness of the face, arm or leg -suicidal thoughts or other mood changes -trouble passing urine or change in the amount of urine -unusual bleeding or bruising -unusually weak or tired Side effects that usually do not require medical attention (report to your doctor or health care professional if they continue or are bothersome): -constipation -headache -nausea, vomiting -strange dreams -stomach gas -trouble sleeping This list may not describe all possible side effects. Call your doctor for medical advice about side effects. You may report side effects to FDA at 1-800-FDA-1088. Where should I keep my medicine? Keep out of the reach of children. Store at room temperature between 15 and 30 degrees C (59 and 86 degrees F). Throw away any unused medicine after the expiration date. NOTE: This sheet is a summary. It may not cover all  possible information. If you have questions about this medicine, talk to your doctor, pharmacist, or health care provider.  2013, Elsevier/Gold Standard. (03/15/2010 3:12:38 PM) Smoking Cessation Quitting smoking is important to your health and has many advantages. However, it is not always easy to quit since nicotine is a very addictive drug. Often times, people try 3 times or more before being able to quit. This document explains the best ways for you to prepare to quit smoking. Quitting takes hard work and a lot of effort, but you can do it. ADVANTAGES OF QUITTING SMOKING You will live longer, feel better, and live better. Your body will feel the impact of quitting smoking almost immediately. Within 20 minutes, blood pressure decreases. Your pulse returns to its normal level. After 8 hours, carbon monoxide levels in the blood return to normal. Your oxygen level increases. After 24 hours, the chance of having a heart attack starts to decrease. Your breath, hair, and body stop smelling like smoke. After 48 hours, damaged nerve endings begin to recover. Your sense of taste and smell improve. After 72 hours, the body is virtually free of nicotine. Your bronchial tubes relax and breathing becomes easier. After 2 to 12 weeks, lungs can hold more air. Exercise becomes easier and circulation improves. The risk of having a heart attack, stroke, cancer, or lung disease is greatly reduced. After 1 year, the risk of coronary heart disease is cut in half. After 5 years, the risk of stroke falls to the same as a nonsmoker. After 10 years, the risk of lung cancer is cut in half and the risk of other cancers decreases significantly. After 15 years, the risk of coronary heart disease drops, usually to the level of a nonsmoker. If you are pregnant, quitting smoking will improve your chances of having a healthy baby. The people you live with, especially any children, will be healthier. You will have extra money  to spend on things other than cigarettes. QUESTIONS TO THINK ABOUT BEFORE ATTEMPTING TO QUIT You may want to talk about your answers with your caregiver. Why do you want to quit? If you tried to quit in the past, what helped and what did not? What will be the most difficult situations for you after you quit? How will you plan to handle them? Who can help you through the tough times? Your family? Friends? A caregiver? What pleasures do you get from smoking? What ways can you still get pleasure if you quit? Here are some questions to ask your caregiver: How can you help me  to be successful at quitting? What medicine do you think would be best for me and how should I take it? What should I do if I need more help? What is smoking withdrawal like? How can I get information on withdrawal? GET READY Set a quit date. Change your environment by getting rid of all cigarettes, ashtrays, matches, and lighters in your home, car, or work. Do not let people smoke in your home. Review your past attempts to quit. Think about what worked and what did not. GET SUPPORT AND ENCOURAGEMENT You have a better chance of being successful if you have help. You can get support in many ways. Tell your family, friends, and co-workers that you are going to quit and need their support. Ask them not to smoke around you. Get individual, group, or telephone counseling and support. Programs are available at [employer] and health centers. Call your local health department for information about programs in your area. Spiritual beliefs and practices may help some smokers quit. Download a "quit meter" on your computer to keep track of quit statistics, such as how long you have gone without smoking, cigarettes not smoked, and money saved. Get a self-help book about quitting smoking and staying off of tobacco. LEARN NEW SKILLS AND BEHAVIORS Distract yourself from urges to smoke. Talk to someone, go for a walk, or occupy your  time with a task. Change your normal routine. Take a different route to work. Drink tea instead of coffee. Eat breakfast in a different place. Reduce your stress. Take a hot bath, exercise, or read a book. Plan something enjoyable to do every day. Reward yourself for not smoking. Explore interactive web-based programs that specialize in helping you quit. GET MEDICINE AND USE IT CORRECTLY Medicines can help you stop smoking and decrease the urge to smoke. Combining medicine with the above behavioral methods and support can greatly increase your chances of successfully quitting smoking. Nicotine replacement therapy helps deliver nicotine to your body without the negative effects and risks of smoking. Nicotine replacement therapy includes nicotine gum, lozenges, inhalers, nasal sprays, and skin patches. Some may be available over-the-counter and others require a prescription. Antidepressant medicine helps people abstain from smoking, but how this works is unknown. This medicine is available by prescription. Nicotinic receptor partial agonist medicine simulates the effect of nicotine in your brain. This medicine is available by prescription. Ask your caregiver for advice about which medicines to use and how to use them based on your health history. Your caregiver will tell you what side effects to look out for if you choose to be on a medicine or therapy. Carefully read the information on the package. Do not use any other product containing nicotine while using a nicotine replacement product.  RELAPSE OR DIFFICULT SITUATIONS Most relapses occur within the first 3 months after quitting. Do not be discouraged if you start smoking again. Remember, most people try several times before finally quitting. You may have symptoms of withdrawal because your body is used to nicotine. You may crave cigarettes, be irritable, feel very hungry, cough often, get headaches, or have difficulty concentrating. The withdrawal  symptoms are only temporary. They are strongest when you first quit, but they will go away within 10 14 days. To reduce the chances of relapse, try to: Avoid drinking alcohol. Drinking lowers your chances of successfully quitting. Reduce the amount of caffeine you consume. Once you quit smoking, the amount of caffeine in your body increases and can give you symptoms, such  as a rapid heartbeat, sweating, and anxiety. Avoid smokers because they can make you want to smoke. Do not let weight gain distract you. Many smokers will gain weight when they quit, usually less than 10 pounds. Eat a healthy diet and stay active. You can always lose the weight gained after you quit. Find ways to improve your mood other than smoking. FOR MORE INFORMATION  www.smokefree.gov  Document Released: 08/02/2001 Document Revised: 02/07/2012 Document Reviewed: 11/17/2011 [facility] Patient Information 2014 [place], [place].

## 2013-04-12 NOTE — Progress Notes (Signed)
Amy Peters 03-19-1971 [id number]   History:    42 y.o.  for annual gyn exam when no complaints today. Patient had Mirena IUD placed in 2013 is having no cycles. Patient continues to smoke a pack cigarette lasting for a few days. Patient had a left breast biopsy which was benign this year and has a followup scheduled for October of this year with a possible MRI. Patient's mother had history of breast cancer. Patient denies any prior history of abnormal Pap smears. Patient several years ago had  hyperthyroidismand had iodine-131 ablation of thyroid gland and since then has been on Synthroid 112 mcg daily. Review of her record indicating she was weighing 191 pounds and is down to 181 pounds.  Past medical history,surgical history, family history and social history were all reviewed and documented in the EPIC chart.  Gynecologic History No LMP recorded. Patient is not currently having periods (Reason: IUD). Contraception: IUD Last Pap: 2012. Results were: normal Last mammogram: see above. Results were: see above  Obstetric History OB History  Gravida Para Term Preterm AB SAB TAB Ectopic Multiple Living  4 2 1 1 2 2    2     # Outcome Date GA Lbr Len/2nd Weight Sex Delivery Anes PTL Lv  4 SAB           3 PRE     M SVD  Y Y  2 TRM     M SVD  N Y  1 SAB             Obstetric Comments  Age first menstrual cycle 87 Age first pregnancy 46 IUD 2008     ROS: A ROS was performed and pertinent positives and negatives are included in the history.  GENERAL: No fevers or chills. HEENT: No change in vision, no earache, sore throat or sinus congestion. NECK: No pain or stiffness. CARDIOVASCULAR: No chest pain or pressure. No palpitations. PULMONARY: No shortness of breath, cough or wheeze. GASTROINTESTINAL: No abdominal pain, nausea, vomiting or diarrhea, melena or bright red blood per rectum. GENITOURINARY: No urinary frequency, urgency, hesitancy or dysuria. MUSCULOSKELETAL: No joint or muscle  pain, no back pain, no recent trauma. DERMATOLOGIC: No rash, no itching, no lesions. ENDOCRINE: No polyuria, polydipsia, no heat or cold intolerance. No recent change in weight. HEMATOLOGICAL: No anemia or easy bruising or bleeding. NEUROLOGIC: No headache, seizures, numbness, tingling or weakness. PSYCHIATRIC: No depression, no loss of interest in normal activity or change in sleep pattern.     Exam: chaperone present  BP 124/82  Ht 5\' 6"  (1.676 m)  Wt 181 lb (82.101 kg)  BMI 29.23 kg/m2  Body mass index is 29.23 kg/(m^2).  General appearance : Well developed well nourished female. No acute distress HEENT: Neck supple, trachea midline, no carotid bruits, no thyroidmegaly Lungs: Clear to auscultation, no rhonchi or wheezes, or rib retractions  Heart: Regular rate and rhythm, no murmurs or gallops Breast:Examined in sitting and supine position were symmetrical in appearance, no palpable masses or tenderness,  no skin retraction, no nipple inversion, no nipple discharge, no skin discoloration, no axillary or supraclavicular lymphadenopathy Abdomen: no palpable masses or tenderness, no rebound or guarding Extremities: no edema or skin discoloration or tenderness  Pelvic:  Bartholin, Urethra, Skene Glands: Within normal limits             Vagina: No gross lesions or discharge  Cervix: No gross lesions or discharge, IUD string seen  Uterus  anteverted, normal size, shape and  consistency, non-tender and mobile  Adnexa  Without masses or tenderness  Anus and perineum  normal   Rectovaginal  normal sphincter tone without palpated masses or tenderness             Hemoccult not indicated     Assessment/Plan:  42 y.o. female for annual exam once again was counseled on the detrimental effects of smoking. She had a chest x-ray last year when she was diagnosed and treated for pleurisy. Patient was reminded take her calcium and vitamin D for osteoporosis prevention. She will follow up with her  mammogram in October as previously recommended. She was reminded to do monthly breast exam. The following lab work today: TSH, screen cholesterol, hemoglobin A1c, CBC and urinalysis. Patient did receive the Tdap vaccine today.    Ok Edwards MD, 1:43 PM 04/12/2013

## 2013-04-13 LAB — URINALYSIS W MICROSCOPIC + REFLEX CULTURE
Bacteria, UA: NONE SEEN
Crystals: NONE SEEN
Ketones, ur: NEGATIVE mg/dL
Nitrite: NEGATIVE
Protein, ur: NEGATIVE mg/dL
Specific Gravity, Urine: 1.01 (ref 1.005–1.030)
Urobilinogen, UA: 0.2 mg/dL (ref 0.0–1.0)

## 2013-04-13 LAB — TSH: TSH: 2.553 u[IU]/mL (ref 0.350–4.500)

## 2013-04-18 ENCOUNTER — Encounter: Payer: Self-pay | Admitting: Gynecology

## 2013-04-24 ENCOUNTER — Encounter: Payer: Self-pay | Admitting: Gynecology

## 2013-04-29 ENCOUNTER — Encounter: Payer: Self-pay | Admitting: Gynecology

## 2013-06-09 ENCOUNTER — Encounter: Payer: Self-pay | Admitting: General Surgery

## 2013-06-11 ENCOUNTER — Ambulatory Visit (INDEPENDENT_AMBULATORY_CARE_PROVIDER_SITE_OTHER): Payer: BC Managed Care – PPO | Admitting: General Surgery

## 2013-06-11 ENCOUNTER — Encounter: Payer: Self-pay | Admitting: General Surgery

## 2013-06-11 VITALS — BP 130/80 | HR 78 | Resp 12 | Ht 66.0 in | Wt 186.0 lb

## 2013-06-11 DIAGNOSIS — Z86018 Personal history of other benign neoplasm: Secondary | ICD-10-CM

## 2013-06-11 DIAGNOSIS — Z803 Family history of malignant neoplasm of breast: Secondary | ICD-10-CM

## 2013-06-11 NOTE — Progress Notes (Signed)
Patient ID: Amy Peters, female   DOB: May 06, 1971, 42 y.o.   MRN: [id number]  Chief Complaint  Patient presents with  . Follow-up    mammogram    HPI Amy Peters is a 42 y.o. female. who presents for a breast evaluation. The most recent mammogram was done on 06/03/13 BI cat 1 . Patient does perform regular self breast checks and gets regular mammograms done.    HPI  Past Medical History  Diagnosis Date  . IUFD (intrauterine fetal death)     @ 80 WEEKS  . Anxiety   . Breast fibroadenoma     LEFT  . Depression   . Bronchitis   . Personal history of tobacco use, presenting hazards to health   . Breast screening, unspecified   . Family history of malignant neoplasm of breast   . Hypothyroid 2005    Past Surgical History  Procedure Laterality Date  . Pilonidal cyst excision  1994  . Dilation and curettage of uterus  1996  . Hysteroscopy  2007    HYST., D&C/POLYP  . Radioactive iodine ablation  12-05-2003    OF THYROID/20 MILLICURIES OF I-131-DR. BALAN  . Intrauterine device insertion  11/2011    Mirena  . Breast surgery Left 2009, 2013    excision left breast lesion  . Dilation and curettage of uterus  1996, 2002    Family History  Problem Relation Age of Onset  . Breast cancer Mother     01/2010  . Cancer Mother     2011, breast  . Hypertension Father   . Cancer Maternal Grandfather     colon    Social History History  Substance Use Topics  . Smoking status: Current Some Day Smoker -- 0.75 packs/day for 15 years    Types: Cigarettes  . Smokeless tobacco: Never Used  . Alcohol Use: Yes    No Known Allergies  Current Outpatient Prescriptions  Medication Sig Dispense Refill  . levothyroxine (SYNTHROID, LEVOTHROID) 112 MCG tablet Take 1 tablet (112 mcg total) by mouth daily before breakfast.  30 tablet  11   Current Facility-Administered Medications  Medication Dose Route Frequency Provider Last Rate Last Dose  . levonorgestrel (MIRENA) 20  MCG/24HR IUD   Intrauterine Once Ok Edwards, MD        Review of Systems Review of Systems  Constitutional: Negative.   Respiratory: Negative.   Cardiovascular: Negative.     Blood pressure 130/80, pulse 78, resp. rate 12, height 5\' 6"  (1.676 m), weight 186 lb (84.369 kg).  Physical Exam Physical Exam  Constitutional: She is oriented to person, place, and time. She appears well-developed and well-nourished.  Eyes: Conjunctivae are normal. No scleral icterus.  Neck: Neck supple. No mass and no thyromegaly present.  Cardiovascular: Normal rate, regular rhythm and normal heart sounds.   Pulmonary/Chest: Breath sounds normal. Right breast exhibits no inverted nipple, no mass, no nipple discharge, no skin change and no tenderness. Left breast exhibits no inverted nipple, no mass, no nipple discharge, no skin change and no tenderness.  Left breast incision well healed.   Abdominal: Soft. Bowel sounds are normal. There is no hepatomegaly. There is no tenderness. No hernia.  Lymphadenopathy:    She has no cervical adenopathy.    She has no axillary adenopathy.  Neurological: She is alert and oriented to person, place, and time.  Skin: Skin is warm and dry.    Data Reviewed Mammogram reviewed. No new findings  Assessment  Stable exam one year post excision of fibroadenoma left breast.Family history of breast cancer     Plan    Patient to return in one year screening mammogram.       Ples Specter 06/11/2013, 4:14 PM

## 2013-06-11 NOTE — Patient Instructions (Addendum)
Patient to return in one year screening mammogram. Advised to continue monthly self breast exam.

## 2014-04-22 ENCOUNTER — Other Ambulatory Visit: Payer: Self-pay | Admitting: Gynecology

## 2014-06-06 ENCOUNTER — Ambulatory Visit (INDEPENDENT_AMBULATORY_CARE_PROVIDER_SITE_OTHER): Payer: BC Managed Care – PPO | Admitting: Gynecology

## 2014-06-06 ENCOUNTER — Encounter: Payer: Self-pay | Admitting: Gynecology

## 2014-06-06 ENCOUNTER — Other Ambulatory Visit (HOSPITAL_COMMUNITY)
Admission: RE | Admit: 2014-06-06 | Discharge: 2014-06-06 | Disposition: A | Discharge status: Home / Self Care | Payer: BC Managed Care – PPO | Source: Ambulatory Visit | Attending: Gynecology | Admitting: Gynecology

## 2014-06-06 VITALS — BP 120/76 | Ht 66.0 in | Wt 186.0 lb

## 2014-06-06 DIAGNOSIS — F17200 Nicotine dependence, unspecified, uncomplicated: Secondary | ICD-10-CM

## 2014-06-06 DIAGNOSIS — Z23 Encounter for immunization: Secondary | ICD-10-CM

## 2014-06-06 DIAGNOSIS — Z01419 Encounter for gynecological examination (general) (routine) without abnormal findings: Secondary | ICD-10-CM | POA: Diagnosis present

## 2014-06-06 DIAGNOSIS — Z1151 Encounter for screening for human papillomavirus (HPV): Secondary | ICD-10-CM | POA: Insufficient documentation

## 2014-06-06 DIAGNOSIS — Z720 Tobacco use: Secondary | ICD-10-CM

## 2014-06-06 LAB — CHOLESTEROL, TOTAL: Cholesterol: 128 mg/dL (ref 0–200)

## 2014-06-06 LAB — CBC WITH DIFFERENTIAL/PLATELET
BASOS ABS: 0.1 10*3/uL (ref 0.0–0.1)
Basophils Relative: 1 % (ref 0–1)
EOS PCT: 4 % (ref 0–5)
Eosinophils Absolute: 0.3 10*3/uL (ref 0.0–0.7)
HEMATOCRIT: 37.7 % (ref 36.0–46.0)
HEMOGLOBIN: 12.8 g/dL (ref 12.0–15.0)
LYMPHS ABS: 1.9 10*3/uL (ref 0.7–4.0)
LYMPHS PCT: 25 % (ref 12–46)
MCH: 31.2 pg (ref 26.0–34.0)
MCHC: 34 g/dL (ref 30.0–36.0)
MCV: 92 fL (ref 78.0–100.0)
MONO ABS: 0.7 10*3/uL (ref 0.1–1.0)
Monocytes Relative: 9 % (ref 3–12)
NEUTROS ABS: 4.7 10*3/uL (ref 1.7–7.7)
Neutrophils Relative %: 61 % (ref 43–77)
Platelets: 222 10*3/uL (ref 150–400)
RBC: 4.1 MIL/uL (ref 3.87–5.11)
RDW: 12.8 % (ref 11.5–15.5)
WBC: 7.7 10*3/uL (ref 4.0–10.5)

## 2014-06-06 LAB — COMPREHENSIVE METABOLIC PANEL
ALBUMIN: 4.4 g/dL (ref 3.5–5.2)
ALT: 15 U/L (ref 0–35)
AST: 14 U/L (ref 0–37)
Alkaline Phosphatase: 53 U/L (ref 39–117)
BUN: 11 mg/dL (ref 6–23)
CHLORIDE: 105 meq/L (ref 96–112)
CO2: 29 meq/L (ref 19–32)
Calcium: 9.5 mg/dL (ref 8.4–10.5)
Creat: 0.79 mg/dL (ref 0.50–1.10)
Glucose, Bld: 80 mg/dL (ref 70–99)
POTASSIUM: 4 meq/L (ref 3.5–5.3)
SODIUM: 139 meq/L (ref 135–145)
Total Bilirubin: 0.4 mg/dL (ref 0.2–1.2)
Total Protein: 6.8 g/dL (ref 6.0–8.3)

## 2014-06-06 LAB — TSH: TSH: 2.861 u[IU]/mL (ref 0.350–4.500)

## 2014-06-06 MED ORDER — LEVOTHYROXINE SODIUM 112 MCG PO TABS: 112.0000 ug | ORAL_TABLET | Freq: Every day | ORAL | Status: DC

## 2014-06-06 NOTE — Patient Instructions (Signed)

## 2014-06-06 NOTE — Addendum Note (Signed)
Addended by: Nelva Nay on: 06/06/2014 03:07 PM   Modules accepted: Orders

## 2014-06-06 NOTE — Progress Notes (Signed)
Amy Peters 04/23/1971 [id number]   History:    43 y.o.  for annual gyn exam with no complaints today.Patient had Mirena IUD placed in 2013 is having no cycles. Patient continues to smoke a pack cigarette lasting for a few days. Patient had a left breast biopsy which was benign in 2014. Patient's mother had history of breast cancer.Patient denies any prior history of abnormal Pap smears. Patient several years ago had hyperthyroidismand had iodine-131 ablation of thyroid gland and since then has been on Synthroid 112 mcg daily. Requesting a flu vaccine today. Patient was weighing 181 last year and was weighing 186 today. Patient is having a menstrual cycles with a Mirena IUD.    Past medical history,surgical history, family history and social history were all reviewed and documented in the EPIC chart.  Gynecologic History No LMP recorded. Patient is not currently having periods (Reason: IUD). Contraception: IUD Last Pap: 2004. Results were: normal Last mammogram: 2040. Results were: Normal since she will need 3-dimensional mammogram  Obstetric History OB History  Gravida Para Term Preterm AB SAB TAB Ectopic Multiple Living  4 2 1 1 2 2    2     # Outcome Date GA Lbr Len/2nd Weight Sex Delivery Anes PTL Lv  4 SAB           3 PRE     M SVD  Y Y  2 TRM     M SVD  N Y  1 SAB             Obstetric Comments  Age first menstrual cycle 48 Age first pregnancy 28 IUD 2008     ROS: A ROS was performed and pertinent positives and negatives are included in the history.  GENERAL: No fevers or chills. HEENT: No change in vision, no earache, sore throat or sinus congestion. NECK: No pain or stiffness. CARDIOVASCULAR: No chest pain or pressure. No palpitations. PULMONARY: No shortness of breath, cough or wheeze. GASTROINTESTINAL: No abdominal pain, nausea, vomiting or diarrhea, melena or bright red blood per rectum. GENITOURINARY: No urinary frequency, urgency, hesitancy or dysuria.  MUSCULOSKELETAL: No joint or muscle pain, no back pain, no recent trauma. DERMATOLOGIC: No rash, no itching, no lesions. ENDOCRINE: No polyuria, polydipsia, no heat or cold intolerance. No recent change in weight. HEMATOLOGICAL: No anemia or easy bruising or bleeding. NEUROLOGIC: No headache, seizures, numbness, tingling or weakness. PSYCHIATRIC: No depression, no loss of interest in normal activity or change in sleep pattern.     Exam: chaperone present  BP 120/76  Ht 5\' 6"  (1.676 m)  Wt 186 lb (84.369 kg)  BMI 30.04 kg/m2  Body mass index is 30.04 kg/(m^2).  General appearance : Well developed well nourished female. No acute distress HEENT: Neck supple, trachea midline, no carotid bruits, no thyroidmegaly Lungs: Clear to auscultation, no rhonchi or wheezes, or rib retractions  Heart: Regular rate and rhythm, no murmurs or gallops Breast:Examined in sitting and supine position were symmetrical in appearance, no palpable masses or tenderness,  no skin retraction, no nipple inversion, no nipple discharge, no skin discoloration, no axillary or supraclavicular lymphadenopathy Abdomen: no palpable masses or tenderness, no rebound or guarding Extremities: no edema or skin discoloration or tenderness  Pelvic:  Bartholin, Urethra, Skene Glands: Within normal limits             Vagina: No gross lesions or discharge  Cervix: No gross lesions or discharge, IUD string seen  Uterus  anteverted, normal size, shape  and consistency, non-tender and mobile  Adnexa  Without masses or tenderness  Anus and perineum  normal   Rectovaginal  normal sphincter tone without palpated masses or tenderness             Hemoccult not indicated     Assessment/Plan:  43 y.o. female for annual exam once again was counseled on the detrimental effects of smoking. She was treated in 2013 pleurisy or chest x-ray was done. She was counseled once again have offered Chantix but she declined. Prescription for Synthroid 112  mcg was provided. Pap smear was done today. The following lab work: CBC, screening cholesterol, comprehensive metabolic, TSH, and urinalysis. We discussed importance of calcium vitamin D and regular exercise for osteoporosis. Patient to schedule her mammogram. Since she is requesting through the history of breast cancer and breast and she received her flu vaccine today.     Terrance Mass MD, 2:40 PM 06/06/2014

## 2014-06-07 LAB — URINALYSIS W MICROSCOPIC + REFLEX CULTURE
Bacteria, UA: NONE SEEN
Bilirubin Urine: NEGATIVE
CRYSTALS: NONE SEEN
Casts: NONE SEEN
GLUCOSE, UA: NEGATIVE mg/dL
Ketones, ur: NEGATIVE mg/dL
Leukocytes, UA: NEGATIVE
Nitrite: NEGATIVE
PH: 6 (ref 5.0–8.0)
Protein, ur: NEGATIVE mg/dL
Specific Gravity, Urine: 1.009 (ref 1.005–1.030)
Squamous Epithelial / LPF: NONE SEEN
UROBILINOGEN UA: 0.2 mg/dL (ref 0.0–1.0)

## 2014-06-10 LAB — CYTOLOGY - PAP

## 2014-06-16 ENCOUNTER — Encounter: Payer: Self-pay | Admitting: General Surgery

## 2014-06-17 ENCOUNTER — Encounter: Payer: Self-pay | Admitting: General Surgery

## 2014-06-17 ENCOUNTER — Ambulatory Visit (INDEPENDENT_AMBULATORY_CARE_PROVIDER_SITE_OTHER): Payer: BC Managed Care – PPO | Admitting: General Surgery

## 2014-06-17 VITALS — BP 130/80 | HR 76 | Resp 12 | Ht 66.0 in | Wt 187.0 lb

## 2014-06-17 DIAGNOSIS — Z803 Family history of malignant neoplasm of breast: Secondary | ICD-10-CM

## 2014-06-17 DIAGNOSIS — Z9889 Other specified postprocedural states: Secondary | ICD-10-CM

## 2014-06-17 NOTE — Progress Notes (Signed)
Patient ID: Amy Peters, female   DOB: 09/06/1970, 43 y.o.   MRN: [id number]  Chief Complaint  Patient presents with  . Follow-up    mammogram    HPI Amy Peters is a 43 y.o. female.  who presents for a breast evaluation. The most recent mammogram was done on 06-13-14.  Patient does perform regular self breast checks and gets regular mammograms done.  No new breast issues.  HPI  Past Medical History  Diagnosis Date  . IUFD (intrauterine fetal death)     @ 31 WEEKS  . Anxiety   . Breast fibroadenoma     LEFT  . Depression   . Bronchitis   . Personal history of tobacco use, presenting hazards to health   . Hypothyroid 2005    Past Surgical History  Procedure Laterality Date  . Pilonidal cyst excision  1994  . Dilation and curettage of uterus  1996  . Hysteroscopy  2007    HYST., D&C/POLYP  . Radioactive iodine ablation  12-05-2003    OF PPIRJJO/84 MILLICURIES OF Z-660-YT. BALAN  . Intrauterine device insertion  11/2011    Mirena  . Breast surgery Left 2009, 2013    excision left breast lesion  . Dilation and curettage of uterus  1996, 2002    Family History  Problem Relation Age of Onset  . Breast cancer Mother 77    01/2010  . Cancer Mother     2011, breast  . Hypertension Father   . Cancer Maternal Grandfather     colon    Social History History  Substance Use Topics  . Smoking status: Current Some Day Smoker -- 15 years    Types: Cigarettes  . Smokeless tobacco: Never Used  . Alcohol Use: Yes     Comment: Socially    No Known Allergies  Current Outpatient Prescriptions  Medication Sig Dispense Refill  . Calcium Carbonate-Vitamin D (CALCIUM + D PO) Take by mouth.      . levothyroxine (SYNTHROID, LEVOTHROID) 112 MCG tablet Take 1 tablet (112 mcg total) by mouth daily before breakfast.  30 tablet  11   Current Facility-Administered Medications  Medication Dose Route Frequency Provider Last Rate Last Dose  . levonorgestrel (MIRENA) 20  MCG/24HR IUD   Intrauterine Once Terrance Mass, MD        Review of Systems Review of Systems  Constitutional: Negative.   Respiratory: Negative.   Cardiovascular: Negative.     Blood pressure 130/80, pulse 76, resp. rate 12, height 5\' 6"  (1.676 m), weight 187 lb (84.823 kg).  Physical Exam Physical Exam  Constitutional: She is oriented to person, place, and time. She appears well-developed and well-nourished.  Eyes: Conjunctivae are normal. No scleral icterus.  Neck: Neck supple.  Cardiovascular: Normal rate, regular rhythm and normal heart sounds.   Pulmonary/Chest: Effort normal and breath sounds normal. Right breast exhibits no inverted nipple, no mass, no nipple discharge, no skin change and no tenderness. Left breast exhibits no inverted nipple, no mass, no nipple discharge, no skin change and no tenderness.  Abdominal: Soft. Normal appearance. There is no hepatosplenomegaly. There is no tenderness.  Lymphadenopathy:    She has no cervical adenopathy.    She has no axillary adenopathy.  Neurological: She is alert and oriented to person, place, and time.  Skin: Skin is warm and dry.    Data Reviewed Mammogram reviewed and stable.  Assessment    Stable physical exam. FH of breast cancer.  Plan    Follow up in one year with bilateral screening mammogram and office visit.       Courtnei Ruddell G 06/18/2014, 6:04 AM

## 2014-06-17 NOTE — Patient Instructions (Addendum)
Continue self breast exams. Call office for any new breast issues or concerns. Follow up in one year with bilateral screening mammogram and office visit 

## 2014-06-18 ENCOUNTER — Encounter: Payer: Self-pay | Admitting: General Surgery

## 2014-06-23 ENCOUNTER — Encounter: Payer: Self-pay | Admitting: General Surgery

## 2014-12-09 NOTE — Op Note (Signed)
PATIENT NAME:  Amy Peters, MAYFIELD MR#:  [id number] DATE OF BIRTH:  September 22, 1970  DATE OF PROCEDURE:  07/13/2012  PREOPERATIVE DIAGNOSIS: Fibroadenoma of left breast, subareolar, at the 3 to 4 o'clock  location.   POSTOPERATIVE DIAGNOSIS: Fibroadenoma of left breast, subareolar, at the 3 to 4 o'clock  location.   OPERATION: Excision of left breast fibroadenoma with ultrasound guidance.   SURGEON: Mckinley Jewel, M.D.   ANESTHESIA: Monitored anesthesia with local anesthetic of 0.5% Marcaine and 1% Xylocaine, 15 mL used.   COMPLICATIONS: None.   ESTIMATED BLOOD LOSS: Minimal.   DRAINS: None.   DESCRIPTION OF PROCEDURE: The patient was placed in the supine position on the operating table. With adequate IV sedation monitoring, the left breast was prepped and draped out as a sterile field. This patient had a 5 to 6 mm fibroadenoma in the subareolar location between 3 to 4 o'clock position. This was better seen by ultrasound than by palpation. The ultrasound probe with a sterile cover was brought up to the field. The lesion was identified, in the lateral aspect of the areolar region, on the left, and the skin marked for the incision. A local anesthetic was instilled and a circumareolar incision was made along the lateral aspect from about the 2 o'clock  to the 4 o'clock position. The subcutaneous tissue was opened and the areola of the skin was then elevated all the way towards the nipple area and by palpation a firm 5 mm nodule was identified and this was verified again with ultrasound to know that that was the area of concern. The breast tissue containing this nodule was excised out, in a circumferential fashion. The excised tissue was queried with ultrasound and showed the presence of this 5 to 6 mm nodule. It was then sent to pathology for routine processing After ensuring hemostasis with cautery, the deeper tissue was closed with 2-0 Vicryl and the skin closed with subcuticular 4-0 Vicryl, covered with  Dermabond. The procedure was well tolerated. The patient was then returned to the recovery room in stable condition. ____________________________ S.Robinette Haines, MD sgs:slb D: 07/13/2012 11:27:01 ET T: 07/13/2012 11:48:46 ET JOB#: [id number]  cc: S.G. Jamal Collin, MD, <Dictator> [facility] Robinette Haines MD ELECTRONICALLY SIGNED 07/16/2012 8:42

## 2015-06-08 ENCOUNTER — Encounter: Payer: Self-pay | Admitting: General Surgery

## 2015-06-19 ENCOUNTER — Encounter: Payer: Self-pay | Admitting: Gynecology

## 2015-06-19 ENCOUNTER — Ambulatory Visit (INDEPENDENT_AMBULATORY_CARE_PROVIDER_SITE_OTHER): Payer: 59 | Admitting: Gynecology

## 2015-06-19 VITALS — BP 132/84 | Ht 66.0 in | Wt 189.0 lb

## 2015-06-19 DIAGNOSIS — Z01419 Encounter for gynecological examination (general) (routine) without abnormal findings: Secondary | ICD-10-CM | POA: Diagnosis not present

## 2015-06-19 DIAGNOSIS — Z720 Tobacco use: Secondary | ICD-10-CM | POA: Diagnosis not present

## 2015-06-19 DIAGNOSIS — IMO0001 Reserved for inherently not codable concepts without codable children: Secondary | ICD-10-CM

## 2015-06-19 DIAGNOSIS — E038 Other specified hypothyroidism: Secondary | ICD-10-CM | POA: Diagnosis not present

## 2015-06-19 DIAGNOSIS — R635 Abnormal weight gain: Secondary | ICD-10-CM | POA: Diagnosis not present

## 2015-06-19 DIAGNOSIS — F17200 Nicotine dependence, unspecified, uncomplicated: Secondary | ICD-10-CM

## 2015-06-19 MED ORDER — LEVOTHYROXINE SODIUM 112 MCG PO TABS: 112.0000 ug | ORAL_TABLET | Freq: Every day | ORAL | Status: DC

## 2015-06-19 NOTE — Progress Notes (Signed)
Amy Peters [birth year] [id number]   History:    44 y.o.  for annual gyn exam  With no complaints today. Patient has been doing well with her Mirena IUD that was placed in 2013 is having no menstrual cycle. She continues to smoke despite numerous times that she has been counseled. She states now she is smoking a pack cigarette that will last her for 3 days. She did mention in 2 years ago she was treated for pleurisy and had a chest x-ray at that time as part of the evaluation in [place]. She also has had 2 left breast biopsies in 2009 and 2013 respectively and both were benign.Patient's mother had history of breast cancer.Patient denies any prior history of abnormal Pap smears. Patient several years ago had hyperthyroidismand had iodine-131 ablation of thyroid gland and since then has been on Synthroid 112 mcg daily.   Past medical history,surgical history, family history and social history were all reviewed and documented in the EPIC chart.  Gynecologic History No LMP recorded. Patient is not currently having periods (Reason: IUD). Contraception: IUD Last Pap: 2015. Results were: normal Last mammogram: 2016 normal but dense. Results were: normal  Obstetric History OB History  Gravida Para Term Preterm AB SAB TAB Ectopic Multiple Living  4 2 1 1 2 2    2     # Outcome Date GA Lbr Len/2nd Weight Sex Delivery Anes PTL Lv  4 SAB           3 Preterm     M Vag-Spont  Y Y  2 Term     M Vag-Spont  N Y  1 SAB             Obstetric Comments  Age first menstrual cycle 89  Age first pregnancy 24  IUD 2008     ROS: A ROS was performed and pertinent positives and negatives are included in the history.  GENERAL: No fevers or chills. HEENT: No change in vision, no earache, sore throat or sinus congestion. NECK: No pain or stiffness. CARDIOVASCULAR: No chest pain or pressure. No palpitations. PULMONARY: No shortness of breath, cough or wheeze. GASTROINTESTINAL: No abdominal  pain, nausea, vomiting or diarrhea, melena or bright red blood per rectum. GENITOURINARY: No urinary frequency, urgency, hesitancy or dysuria. MUSCULOSKELETAL: No joint or muscle pain, no back pain, no recent trauma. DERMATOLOGIC: No rash, no itching, no lesions. ENDOCRINE: No polyuria, polydipsia, no heat or cold intolerance. No recent change in weight. HEMATOLOGICAL: No anemia or easy bruising or bleeding. NEUROLOGIC: No headache, seizures, numbness, tingling or weakness. PSYCHIATRIC: No depression, no loss of interest in normal activity or change in sleep pattern.     Exam: chaperone present  BP 132/84 mmHg  Ht 5\' 6"  (1.676 m)  Wt 189 lb (85.73 kg)  BMI 30.52 kg/m2  Body mass index is 30.52 kg/(m^2).  General appearance : Well developed well nourished female. No acute distress HEENT: Eyes: no retinal hemorrhage or exudates,  Neck supple, trachea midline, no carotid bruits, no thyroidmegaly Lungs: Clear to auscultation, no rhonchi or wheezes, or rib retractions  Heart: Regular rate and rhythm, no murmurs or gallops Breast:Examined in sitting and supine position were symmetrical in appearance, no palpable masses or tenderness,  no skin retraction, no nipple inversion, no nipple discharge, no skin discoloration, no axillary or supraclavicular lymphadenopathy Abdomen: no palpable masses or tenderness, no rebound or guarding Extremities: no edema or skin discoloration or tenderness  Pelvic:  Bartholin, Urethra,  Skene Glands: Within normal limits             Vagina: No gross lesions or discharge  Cervix: No gross lesions or discharge, IUD string visualized  Uterus  anteverted, normal size, shape and consistency, non-tender and mobile  Adnexa  Without masses or tenderness  Anus and perineum  normal   Rectovaginal  normal sphincter tone without palpated masses or tenderness             Hemoccult not indicated     Assessment/Plan:  44 y.o. female for annual exam who was once again  counseled on the detrimental effects of smoking. Literature information and tie smoking was provided as well as information on the Chantix. Patient will return back to the office next week in a fasting state for the following screening blood work: Comprehensive metabolic panel, fasting lipid profile, TSH, CBC, and urinalysis. I have recommended that next year she requests a three-dimensional mammogram because her history of dense breasts.   Terrance Mass MD, 10:40 AM 06/19/2015

## 2015-06-19 NOTE — Patient Instructions (Signed)
Varenicline oral tablets What is this medicine? VARENICLINE (var EN i kleen) is used to help people quit smoking. It can reduce the symptoms caused by stopping smoking. It is used with a patient support program recommended by your physician. This medicine may be used for other purposes; ask your health care provider or pharmacist if you have questions. What should I tell my health care provider before I take this medicine? They need to know if you have any of these conditions: -bipolar disorder, depression, schizophrenia or other mental illness -heart disease -if you often drink alcohol -kidney disease -peripheral vascular disease -seizures -stroke -suicidal thoughts, plans, or attempt; a previous suicide attempt by you or a family member -an unusual or allergic reaction to varenicline, other medicines, foods, dyes, or preservatives -pregnant or trying to get pregnant -breast-feeding How should I use this medicine? Take this medicine by mouth after eating. Take with a full glass of water. Follow the directions on the prescription label. Take your doses at regular intervals. Do not take your medicine more often than directed. There are 3 ways you can use this medicine to help you quit smoking; talk to your health care professional to decide which plan is right for you: 1) you can choose a quit date and start this medicine 1 week before the quit date, or, 2) you can start taking this medicine before you choose a quit date, and then pick a quit date between day 8 and 35 days of treatment, or, 3) if you are not sure that you are able or willing to quit smoking right away, start taking this medicine and slowly decrease the amount you smoke as directed by your health care professional with the goal of being cigarette-free by week 12 of treatment. Stick to your plan; ask about support groups or other ways to help you remain cigarette-free. If you are motivated to quit smoking and did not succeed  during a previous attempt with this medicine for reasons other than side effects, or if you returned to smoking after this treatment, speak with your health care professional about whether another course of this medicine may be right for you. A special MedGuide will be given to you by the pharmacist with each prescription and refill. Be sure to read this information carefully each time. Talk to your pediatrician regarding the use of this medicine in children. This medicine is not approved for use in children. Overdosage: If you think you have taken too much of this medicine contact a poison control center or emergency room at once. NOTE: This medicine is only for you. Do not share this medicine with others. What if I miss a dose? If you miss a dose, take it as soon as you can. If it is almost time for your next dose, take only that dose. Do not take double or extra doses. What may interact with this medicine? -alcohol or any product that contains alcohol -insulin -other stop smoking aids -theophylline -warfarin This list may not describe all possible interactions. Give your health care provider a list of all the medicines, herbs, non-prescription drugs, or dietary supplements you use. Also tell them if you smoke, drink alcohol, or use illegal drugs. Some items may interact with your medicine. What should I watch for while using this medicine? Visit your doctor or health care professional for regular check ups. Ask for ongoing advice and encouragement from your doctor or healthcare professional, friends, and family to help you quit. If you smoke while on  this medication, quit again Your mouth may get dry. Chewing sugarless gum or sucking hard candy, and drinking plenty of water may help. Contact your doctor if the problem does not go away or is severe. You may get drowsy or dizzy. Do not drive, use machinery, or do anything that needs mental alertness until you know how this medicine affects you. Do  not stand or sit up quickly, especially if you are an older patient. This reduces the risk of dizzy or fainting spells. Sleepwalking can happen during treatment with this medicine, and can sometimes lead to behavior that is harmful to you, other people, or property. Stop taking this medicine and tell your doctor if you start sleepwalking or have other unusual sleep-related activity. Decrease the amount of alcoholic beverages that you drink during treatment with this medicine until you know if this medicine affects your ability to tolerate alcohol. Some people have experienced increased drunkenness (intoxication), unusual or sometimes aggressive behavior, or no memory of things that have happened (amnesia) during treatment with this medicine. The use of this medicine may increase the chance of suicidal thoughts or actions. Pay special attention to how you are responding while on this medicine. Any worsening of mood, or thoughts of suicide or dying should be reported to your health care professional right away. What side effects may I notice from receiving this medicine? Side effects that you should report to your doctor or health care professional as soon as possible: -allergic reactions like skin rash, itching or hives, swelling of the face, lips, tongue, or throat -acting aggressive, being angry or violent, or acting on dangerous impulses -breathing problems -changes in vision -chest pain or chest tightness -confusion, trouble speaking or understanding -new or worsening depression, anxiety, or panic attacks -extreme increase in activity and talking (mania) -fast, irregular heartbeat -feeling faint or lightheaded, falls -fever -pain in legs when walking -problems with balance, talking, walking -redness, blistering, peeling or loosening of the skin, including inside the mouth -ringing in ears -seeing or hearing things that aren't there (hallucinations) -seizures -sleepwalking -sudden numbness  or weakness of the face, arm or leg -thoughts about suicide or dying, or attempts to commit suicide -trouble passing urine or change in the amount of urine -unusual bleeding or bruising -unusually weak or tired Side effects that usually do not require medical attention (report to your doctor or health care professional if they continue or are bothersome): -constipation -headache -nausea, vomiting -strange dreams -stomach gas -trouble sleeping This list may not describe all possible side effects. Call your doctor for medical advice about side effects. You may report side effects to FDA at 1-800-FDA-1088. Where should I keep my medicine? Keep out of the reach of children. Store at room temperature between 15 and 30 degrees C (59 and 86 degrees F). Throw away any unused medicine after the expiration date. NOTE: This sheet is a summary. It may not cover all possible information. If you have questions about this medicine, talk to your doctor, pharmacist, or health care provider.    2016, Elsevier/Gold Standard. (2015-04-23 16:14:23) Steps to Quit Smoking  Smoking tobacco can be harmful to your health and can affect almost every organ in your body. Smoking puts you, and those around you, at risk for developing many serious chronic diseases. Quitting smoking is difficult, but it is one of the best things that you can do for your health. It is never too late to quit. WHAT ARE THE BENEFITS OF QUITTING SMOKING? When you quit smoking,  you lower your risk of developing serious diseases and conditions, such as:  Lung cancer or lung disease, such as COPD.  Heart disease.  Stroke.  Heart attack.  Infertility.  Osteoporosis and bone fractures. Additionally, symptoms such as coughing, wheezing, and shortness of breath may get better when you quit. You may also find that you get sick less often because your body is stronger at fighting off colds and infections. If you are pregnant, quitting smoking  can help to reduce your chances of having a baby of low birth weight. HOW DO I GET READY TO QUIT? When you decide to quit smoking, create a plan to make sure that you are successful. Before you quit:  Pick a date to quit. Set a date within the next two weeks to give you time to prepare.  Write down the reasons why you are quitting. Keep this list in places where you will see it often, such as on your bathroom mirror or in your car or wallet.  Identify the people, places, things, and activities that make you want to smoke (triggers) and avoid them. Make sure to take these actions:  Throw away all cigarettes at home, at work, and in your car.  Throw away smoking accessories, such as Scientist, research (medical).  Clean your car and make sure to empty the ashtray.  Clean your home, including curtains and carpets.  Tell your family, friends, and coworkers that you are quitting. Support from your loved ones can make quitting easier.  Talk with your health care provider about your options for quitting smoking.  Find out what treatment options are covered by your health insurance. WHAT STRATEGIES CAN I USE TO QUIT SMOKING?  Talk with your healthcare provider about different strategies to quit smoking. Some strategies include:  Quitting smoking altogether instead of gradually lessening how much you smoke over a period of time. Research shows that quitting "cold [place]" is more successful than gradually quitting.  Attending in-person counseling to help you build problem-solving skills. You are more likely to have success in quitting if you attend several counseling sessions. Even short sessions of 10 minutes can be effective.  Finding resources and support systems that can help you to quit smoking and remain smoke-free after you quit. These resources are most helpful when you use them often. They can include:  Online chats with a Social worker.  Telephone quitlines.  Printed Social research officer, government.  Support groups or group counseling.  Text messaging programs.  Mobile phone applications.  Taking medicines to help you quit smoking. (If you are pregnant or breastfeeding, talk with your health care provider first.) Some medicines contain nicotine and some do not. Both types of medicines help with cravings, but the medicines that include nicotine help to relieve withdrawal symptoms. Your health care provider may recommend:  Nicotine patches, gum, or lozenges.  Nicotine inhalers or sprays.  Non-nicotine medicine that is taken by mouth. Talk with your health care provider about combining strategies, such as taking medicines while you are also receiving in-person counseling. Using these two strategies together makes you more likely to succeed in quitting than if you used either strategy on its own. If you are pregnant or breastfeeding, talk with your health care provider about finding counseling or other support strategies to quit smoking. Do not take medicine to help you quit smoking unless told to do so by your health care provider. WHAT THINGS CAN I DO TO MAKE IT EASIER TO QUIT? Quitting smoking might feel overwhelming  at first, but there is a lot that you can do to make it easier. Take these important actions:  Reach out to your family and friends and ask that they support and encourage you during this time. Call telephone quitlines, reach out to support groups, or work with a counselor for support.  Ask people who smoke to avoid smoking around you.  Avoid places that trigger you to smoke, such as bars, parties, or smoke-break areas at work.  Spend time around people who do not smoke.  Lessen stress in your life, because stress can be a smoking trigger for some people. To lessen stress, try:  Exercising regularly.  Deep-breathing exercises.  Yoga.  Meditating.  Performing a body scan. This involves closing your eyes, scanning your body from head to toe, and  noticing which parts of your body are particularly tense. Purposefully relax the muscles in those areas.  Download or purchase mobile phone or tablet apps (applications) that can help you stick to your quit plan by providing reminders, tips, and encouragement. There are many free apps, such as QuitGuide from the [employer] Office manager for Disease Control and Prevention). You can find other support for quitting smoking (smoking cessation) through smokefree.gov and other websites. HOW WILL I FEEL WHEN I QUIT SMOKING? Within the first 24 hours of quitting smoking, you may start to feel some withdrawal symptoms. These symptoms are usually most noticeable 2-3 days after quitting, but they usually do not last beyond 2-3 weeks. Changes or symptoms that you might experience include:  Mood swings.  Restlessness, anxiety, or irritation.  Difficulty concentrating.  Dizziness.  Strong cravings for sugary foods in addition to nicotine.  Mild weight gain.  Constipation.  Nausea.  Coughing or a sore throat.  Changes in how your medicines work in your body.  A depressed mood.  Difficulty sleeping (insomnia). After the first 2-3 weeks of quitting, you may start to notice more positive results, such as:  Improved sense of smell and taste.  Decreased coughing and sore throat.  Slower heart rate.  Lower blood pressure.  Clearer skin.  The ability to breathe more easily.  Fewer sick days. Quitting smoking is very challenging for most people. Do not get discouraged if you are not successful the first time. Some people need to make many attempts to quit before they achieve long-term success. Do your best to stick to your quit plan, and talk with your health care provider if you have any questions or concerns.   This information is not intended to replace advice given to you by your health care provider. Make sure you discuss any questions you have with your health care provider.   Document Released:  08/02/2001 Document Revised: 12/23/2014 Document Reviewed: 12/23/2014 Elsevier Interactive Patient Education [employer].

## 2015-06-23 ENCOUNTER — Ambulatory Visit (INDEPENDENT_AMBULATORY_CARE_PROVIDER_SITE_OTHER): Payer: 59 | Admitting: General Surgery

## 2015-06-23 ENCOUNTER — Encounter: Payer: Self-pay | Admitting: General Surgery

## 2015-06-23 VITALS — BP 128/78 | HR 76 | Resp 12 | Ht 66.0 in | Wt 189.0 lb

## 2015-06-23 DIAGNOSIS — Z803 Family history of malignant neoplasm of breast: Secondary | ICD-10-CM | POA: Diagnosis not present

## 2015-06-23 DIAGNOSIS — Z9889 Other specified postprocedural states: Secondary | ICD-10-CM | POA: Diagnosis not present

## 2015-06-23 NOTE — Patient Instructions (Addendum)
Patient will be asked to return to the office in one year with a bilateral screening mammogram.  Continue self breast exams. Call office for any new breast issues or concerns.  

## 2015-06-23 NOTE — Progress Notes (Signed)
Patient ID: Amy Peters, female   DOB: 1971/06/19, 44 y.o.   MRN: [id number]  Chief Complaint  Patient presents with  . Follow-up    mammogram    HPI Amy Peters is a 44 y.o. female who presents for a breast evaluation. The most recent mammogram was done on 06/17/15.  Patient does perform regular self breast checks and gets regular mammograms done.   I have reviewed the history of present illness with the patient. HPI  Past Medical History  Diagnosis Date  . IUFD (intrauterine fetal death)     @ 27 WEEKS  . Anxiety   . Breast fibroadenoma     LEFT  . Depression   . Bronchitis   . Personal history of tobacco use, presenting hazards to health   . Hypothyroid 2005    Past Surgical History  Procedure Laterality Date  . Pilonidal cyst excision  1994  . Dilation and curettage of uterus  1996  . Hysteroscopy  2007    HYST., D&C/POLYP  . Radioactive iodine ablation  12-05-2003    OF [id number] MILLICURIES OF [id number]. BALAN  . Intrauterine device insertion  11/2011    Mirena  . Breast surgery Left 2009, 2013    excision left breast lesion  . Dilation and curettage of uterus  1996, 2002    Family History  Problem Relation Age of Onset  . Breast cancer Mother 84    01/2010  . Cancer Mother     2011, breast  . Hypertension Father   . Cancer Maternal Grandfather     colon    Social History Social History  Substance Use Topics  . Smoking status: Current Some Day Smoker -- 15 years    Types: Cigarettes  . Smokeless tobacco: Never Used  . Alcohol Use: 0.0 oz/week    0 Standard drinks or equivalent per week     Comment: Socially    No Known Allergies  Current Outpatient Prescriptions  Medication Sig Dispense Refill  . Calcium Carbonate-Vitamin D (CALCIUM + D PO) Take by mouth.    . levothyroxine (SYNTHROID, LEVOTHROID) 112 MCG tablet Take 1 tablet (112 mcg total) by mouth daily before breakfast. 30 tablet 11   Current Facility-Administered Medications   Medication Dose Route Frequency Provider Last Rate Last Dose  . levonorgestrel (MIRENA) 20 MCG/24HR IUD   Intrauterine Once Terrance Mass, MD        Review of Systems Review of Systems  Constitutional: Negative.   Respiratory: Negative.   Cardiovascular: Negative.     Blood pressure 128/78, pulse 76, resp. rate 12, height 5\' 6"  (1.676 m), weight 189 lb (85.73 kg).  Physical Exam Physical Exam  Constitutional: She is oriented to person, place, and time. She appears well-developed and well-nourished.  Eyes: Conjunctivae are normal. No scleral icterus.  Neck: Neck supple.  Cardiovascular: Normal rate, regular rhythm and normal heart sounds.   Pulmonary/Chest: Effort normal and breath sounds normal. Right breast exhibits no inverted nipple, no mass, no nipple discharge, no skin change and no tenderness. Left breast exhibits no inverted nipple, no mass, no nipple discharge, no skin change and no tenderness.  Excision site well healed on left breast  Abdominal: Soft. Bowel sounds are normal. There is no tenderness.  Lymphadenopathy:    She has no cervical adenopathy.    She has no axillary adenopathy.  Neurological: She is alert and oriented to person, place, and time.  Skin: Skin is warm and dry.  Data Reviewed Mammogram reviewed, no suspicious findings   Assessment    Stable exam, history of fibroadenoma s/p complete excision and fhx of breast and colon cancer.     Plan    Patient will be asked to return to the office in one year with a bilateral screening mammogram.        Lorely Bubb G 06/23/2015, 6:14 PM

## 2015-06-30 ENCOUNTER — Other Ambulatory Visit: Payer: 59

## 2015-06-30 LAB — TSH: TSH: 3.247 u[IU]/mL (ref 0.350–4.500)

## 2015-06-30 LAB — COMPREHENSIVE METABOLIC PANEL
ALBUMIN: 4.3 g/dL (ref 3.6–5.1)
ALT: 32 U/L — ABNORMAL HIGH (ref 6–29)
AST: 29 U/L (ref 10–30)
Alkaline Phosphatase: 51 U/L (ref 33–115)
BILIRUBIN TOTAL: 0.4 mg/dL (ref 0.2–1.2)
BUN: 13 mg/dL (ref 7–25)
CHLORIDE: 108 mmol/L (ref 98–110)
CO2: 25 mmol/L (ref 20–31)
Calcium: 9.4 mg/dL (ref 8.6–10.2)
Creat: 0.74 mg/dL (ref 0.50–1.10)
GLUCOSE: 86 mg/dL (ref 65–99)
Potassium: 4.8 mmol/L (ref 3.5–5.3)
Sodium: 144 mmol/L (ref 135–146)
Total Protein: 6.8 g/dL (ref 6.1–8.1)

## 2015-06-30 LAB — LIPID PANEL
CHOL/HDL RATIO: 3.6 ratio (ref <=5.0)
Cholesterol: 140 mg/dL (ref 125–200)
HDL: 39 mg/dL — ABNORMAL LOW (ref >=46)
LDL CALC: 78 mg/dL (ref <130)
TRIGLYCERIDES: 113 mg/dL (ref <150)
VLDL: 23 mg/dL (ref <30)

## 2015-06-30 LAB — CBC WITH DIFFERENTIAL/PLATELET
Basophils Absolute: 0 10*3/uL (ref 0.0–0.1)
Basophils Relative: 0 % (ref 0–1)
EOS PCT: 4 % (ref 0–5)
Eosinophils Absolute: 0.3 10*3/uL (ref 0.0–0.7)
HEMATOCRIT: 39.7 % (ref 36.0–46.0)
HEMOGLOBIN: 12.9 g/dL (ref 12.0–15.0)
LYMPHS ABS: 1.9 10*3/uL (ref 0.7–4.0)
LYMPHS PCT: 27 % (ref 12–46)
MCH: 30.9 pg (ref 26.0–34.0)
MCHC: 32.5 g/dL (ref 30.0–36.0)
MCV: 95.2 fL (ref 78.0–100.0)
MPV: 11.6 fL (ref 8.6–12.4)
Monocytes Absolute: 0.6 10*3/uL (ref 0.1–1.0)
Monocytes Relative: 9 % (ref 3–12)
NEUTROS ABS: 4.3 10*3/uL (ref 1.7–7.7)
Neutrophils Relative %: 60 % (ref 43–77)
Platelets: 195 10*3/uL (ref 150–400)
RBC: 4.17 MIL/uL (ref 3.87–5.11)
RDW: 12.8 % (ref 11.5–15.5)
WBC: 7.2 10*3/uL (ref 4.0–10.5)

## 2015-07-01 ENCOUNTER — Other Ambulatory Visit: Payer: Self-pay | Admitting: Gynecology

## 2015-07-01 ENCOUNTER — Telehealth: Payer: Self-pay

## 2015-07-01 DIAGNOSIS — R740 Nonspecific elevation of levels of transaminase and lactic acid dehydrogenase [LDH]: Principal | ICD-10-CM

## 2015-07-01 DIAGNOSIS — R7401 Elevation of levels of liver transaminase levels: Secondary | ICD-10-CM

## 2015-07-01 LAB — URINALYSIS W MICROSCOPIC + REFLEX CULTURE
BILIRUBIN URINE: NEGATIVE
Bacteria, UA: NONE SEEN [HPF]
Casts: NONE SEEN [LPF]
Crystals: NONE SEEN [HPF]
GLUCOSE, UA: NEGATIVE
Hgb urine dipstick: NEGATIVE
Ketones, ur: NEGATIVE
LEUKOCYTES UA: NEGATIVE
NITRITE: NEGATIVE
PH: 6 (ref 5.0–8.0)
PROTEIN: NEGATIVE
RBC / HPF: NONE SEEN RBC/HPF (ref <=2)
SPECIFIC GRAVITY, URINE: 1.005 (ref 1.001–1.035)
WBC UA: NONE SEEN WBC/HPF (ref <=5)
YEAST: NONE SEEN [HPF]

## 2015-07-01 NOTE — Telephone Encounter (Signed)
-----   Message from Terrance Mass, MD sent at 07/01/2015  9:52 AM EST ----- Please inform patient that one of her liver function tests were slightly elevated. I would like to repeat an SGOT and SGPT next week in a fasting state

## 2015-07-01 NOTE — Telephone Encounter (Signed)
Patient informed. Order placed and lab appt made. She works in [employer] and could not come until she has 1/2 day off on 07/15/15.

## 2015-07-01 NOTE — Telephone Encounter (Signed)
Patient was informed of below. She wanted you to know she was fasting for this test with exception of black coffee.

## 2015-07-01 NOTE — Telephone Encounter (Signed)
That's okay I still need to repeat her tests next week

## 2015-07-07 ENCOUNTER — Other Ambulatory Visit: Payer: 59

## 2015-07-07 DIAGNOSIS — R740 Nonspecific elevation of levels of transaminase and lactic acid dehydrogenase [LDH]: Principal | ICD-10-CM

## 2015-07-07 DIAGNOSIS — R7401 Elevation of levels of liver transaminase levels: Secondary | ICD-10-CM

## 2015-07-07 LAB — ALT: ALT: 22 U/L (ref 6–29)

## 2015-07-07 LAB — AST: AST: 16 U/L (ref 10–30)

## 2015-07-15 ENCOUNTER — Other Ambulatory Visit: Payer: Self-pay

## 2016-05-03 ENCOUNTER — Other Ambulatory Visit: Payer: Self-pay | Admitting: General Surgery

## 2016-05-03 DIAGNOSIS — Z1231 Encounter for screening mammogram for malignant neoplasm of breast: Secondary | ICD-10-CM

## 2016-05-13 ENCOUNTER — Other Ambulatory Visit: Payer: Self-pay | Admitting: *Deleted

## 2016-05-13 ENCOUNTER — Inpatient Hospital Stay
Admission: RE | Admit: 2016-05-13 | Discharge: 2016-05-13 | Disposition: A | Discharge status: Home / Self Care | Payer: Self-pay | Source: Ambulatory Visit | Attending: *Deleted | Admitting: *Deleted

## 2016-05-13 DIAGNOSIS — Z9289 Personal history of other medical treatment: Secondary | ICD-10-CM

## 2016-05-17 ENCOUNTER — Encounter: Payer: Self-pay | Admitting: *Deleted

## 2016-06-03 ENCOUNTER — Encounter: Payer: 59 | Admitting: Gynecology

## 2016-06-06 ENCOUNTER — Encounter: Payer: 59 | Admitting: Gynecology

## 2016-06-10 ENCOUNTER — Other Ambulatory Visit: Payer: Self-pay | Admitting: General Surgery

## 2016-06-10 ENCOUNTER — Ambulatory Visit
Admission: RE | Admit: 2016-06-10 | Discharge: 2016-06-10 | Disposition: A | Discharge status: Home / Self Care | Payer: 59 | Source: Ambulatory Visit | Attending: General Surgery | Admitting: General Surgery

## 2016-06-10 DIAGNOSIS — Z1231 Encounter for screening mammogram for malignant neoplasm of breast: Secondary | ICD-10-CM

## 2016-06-17 ENCOUNTER — Encounter: Payer: 59 | Admitting: Gynecology

## 2016-06-22 ENCOUNTER — Encounter: Payer: Self-pay | Admitting: *Deleted

## 2016-06-23 ENCOUNTER — Encounter: Payer: Self-pay | Admitting: General Surgery

## 2016-06-23 ENCOUNTER — Ambulatory Visit (INDEPENDENT_AMBULATORY_CARE_PROVIDER_SITE_OTHER): Payer: 59 | Admitting: General Surgery

## 2016-06-23 VITALS — BP 128/74 | HR 76 | Resp 12 | Ht 66.0 in | Wt 190.0 lb

## 2016-06-23 DIAGNOSIS — Z86018 Personal history of other benign neoplasm: Secondary | ICD-10-CM

## 2016-06-23 DIAGNOSIS — Z803 Family history of malignant neoplasm of breast: Secondary | ICD-10-CM

## 2016-06-23 NOTE — Patient Instructions (Signed)
The patient is aware to call back for any questions or concerns.  

## 2016-06-23 NOTE — Progress Notes (Signed)
Patient ID: Amy Peters, female   DOB: Jun 23, 1971, 45 y.o.   MRN: [id number]  Chief Complaint  Patient presents with  . Follow-up    mammogram    HPI Amy Peters is a 45 y.o. female.  who presents for a breast evaluation. The most recent mammogram was done on 06-10-16.  Patient does perform regular self breast checks and gets regular mammograms done.  No new breast issues. History of fibroadenoma   Excision in 2013. FH of breast cancer.    HPI  Past Medical History:  Diagnosis Date  . Anxiety   . Breast fibroadenoma    LEFT  . Bronchitis   . Depression   . Hypothyroid 2005  . IUFD (intrauterine fetal death)    @ 33 WEEKS  . Personal history of tobacco use, presenting hazards to health     Past Surgical History:  Procedure Laterality Date  . BREAST EXCISIONAL BIOPSY Left 07/13/2012   neg  . BREAST EXCISIONAL BIOPSY Left 2011   neg  . BREAST SURGERY Left 2009, 2013   excision left breast lesion  . DILATION AND CURETTAGE OF UTERUS  1996  . [place], 2002  . HYSTEROSCOPY  2007   HYST., D&C/POLYP  . INTRAUTERINE DEVICE INSERTION  11/2011   Mirena  . PILONIDAL CYST EXCISION  1994  . RADIOACTIVE IODINE ABLATION  12-05-2003   OF [id number] MILLICURIES OF [id number]. BALAN    Family History  Problem Relation Age of Onset  . Breast cancer Mother 62    01/2010  . Cancer Mother     2011, breast  . Hypertension Father   . Cancer Maternal Grandfather     colon    Social History Social History  Substance Use Topics  . Smoking status: Current Some Day Smoker    Years: 15.00    Types: Cigarettes  . Smokeless tobacco: Never Used  . Alcohol use 0.0 oz/week     Comment: Socially    No Known Allergies  Current Outpatient Prescriptions  Medication Sig Dispense Refill  . Calcium Carbonate-Vitamin D (CALCIUM + D PO) Take by mouth every other day.     . levonorgestrel (MIRENA) 20 MCG/24HR IUD by Intrauterine route.    Marland Kitchen  levothyroxine (SYNTHROID, LEVOTHROID) 112 MCG tablet Take 1 tablet (112 mcg total) by mouth daily before breakfast. 30 tablet 11   No current facility-administered medications for this visit.     Review of Systems Review of Systems  Constitutional: Negative.   Respiratory: Negative.   Cardiovascular: Negative.     Blood pressure 128/74, pulse 76, resp. rate 12, height 5\' 6"  (1.676 m), weight 190 lb (86.2 kg).  Physical Exam Physical Exam  Constitutional: She is oriented to person, place, and time. She appears well-developed and well-nourished.  HENT:  Mouth/Throat: Oropharynx is clear and moist.  Eyes: Conjunctivae are normal. No scleral icterus.  Neck: Neck supple.  Cardiovascular: Normal rate, regular rhythm and normal heart sounds.   Pulmonary/Chest: Effort normal and breath sounds normal. Right breast exhibits no inverted nipple, no mass, no nipple discharge, no skin change and no tenderness. Left breast exhibits no inverted nipple, no mass, no nipple discharge, no skin change and no tenderness.  Abdominal: Soft. There is no tenderness.  Lymphadenopathy:    She has no cervical adenopathy.    She has no axillary adenopathy.  Neurological: She is alert and oriented to person, place, and time.  Skin: Skin is warm and dry.  Psychiatric: Her behavior is normal.    Data Reviewed Mammogram reviewed, no suspicious findings.    Assessment    Stable exam, history of fibroadenoma s/p complete excision and FH of breast and colon cancer.     Plan    Patient will be asked to return to the office in one year with a bilateral screening mammogram.      This information has been scribed by Karie Fetch RN, BSN,BC.   Maybree Riling G 06/23/2016, 4:42 PM

## 2016-06-24 ENCOUNTER — Ambulatory Visit (INDEPENDENT_AMBULATORY_CARE_PROVIDER_SITE_OTHER): Payer: 59 | Admitting: Gynecology

## 2016-06-24 ENCOUNTER — Encounter: Payer: Self-pay | Admitting: Gynecology

## 2016-06-24 VITALS — BP 124/80 | Ht 66.0 in | Wt 189.0 lb

## 2016-06-24 DIAGNOSIS — F17200 Nicotine dependence, unspecified, uncomplicated: Secondary | ICD-10-CM

## 2016-06-24 DIAGNOSIS — E038 Other specified hypothyroidism: Secondary | ICD-10-CM | POA: Diagnosis not present

## 2016-06-24 DIAGNOSIS — Z01419 Encounter for gynecological examination (general) (routine) without abnormal findings: Secondary | ICD-10-CM

## 2016-06-24 LAB — CBC WITH DIFFERENTIAL/PLATELET
BASOS PCT: 1 %
Basophils Absolute: 71 cells/uL (ref 0–200)
EOS PCT: 4 %
Eosinophils Absolute: 284 cells/uL (ref 15–500)
HEMATOCRIT: 39.2 % (ref 35.0–45.0)
Hemoglobin: 12.8 g/dL (ref 11.7–15.5)
LYMPHS ABS: 2130 {cells}/uL (ref 850–3900)
LYMPHS PCT: 30 %
MCH: 31.4 pg (ref 27.0–33.0)
MCHC: 32.7 g/dL (ref 32.0–36.0)
MCV: 96.3 fL (ref 80.0–100.0)
MONO ABS: 497 {cells}/uL (ref 200–950)
MPV: 11.7 fL (ref 7.5–12.5)
Monocytes Relative: 7 %
Neutro Abs: 4118 cells/uL (ref 1500–7800)
Neutrophils Relative %: 58 %
PLATELETS: 209 10*3/uL (ref 140–400)
RBC: 4.07 MIL/uL (ref 3.80–5.10)
RDW: 12.8 % (ref 11.0–15.0)
WBC: 7.1 10*3/uL (ref 3.8–10.8)

## 2016-06-24 LAB — TSH: TSH: 1.83 mIU/L

## 2016-06-24 MED ORDER — LEVOTHYROXINE SODIUM 112 MCG PO TABS: 112.0000 ug | ORAL_TABLET | Freq: Every day | ORAL | 11 refills | Status: DC

## 2016-06-24 NOTE — Patient Instructions (Signed)

## 2016-06-24 NOTE — Progress Notes (Signed)
Amy Peters [birth year] [id number]   History:    45 y.o.  for annual gyn exam has no complaints today. Patient continues to smoke one full pack a cigarettes every 3 days. She has been counseled on numerous occasions in the past and offered Chantix but has declined. She had a Mirena IUD placed in 2013 and is due to be removed next year reports very light if any menstrual cycle. Patient had mentioned that approximately 3 years ago she was treated for pleurisy and had a chest x-ray at that time as part of the evaluation in [place]. She also has had 2 left breast biopsies in 2009 and 2013 respectively and both were benign.Patient's mother had history of breast cancer.Patient denies any prior history of abnormal Pap smears. Patient several years ago had hyperthyroidismand had iodine-131 ablation of thyroid gland and since then has been on Synthroid 112 mcg daily  Past medical history,surgical history, family history and social history were all reviewed and documented in the EPIC chart.  Gynecologic History No LMP recorded. Patient is not currently having periods (Reason: IUD). Contraception: IUD Last Pap: 2012 and 2015. Results were: normal Last mammogram: 2017. Results were: normal  Obstetric History OB History  Gravida Para Term Preterm AB Living  4 2 1 1 2 2   SAB TAB Ectopic Multiple Live Births  2       2    # Outcome Date GA Lbr Len/2nd Weight Sex Delivery Anes PTL Lv  4 SAB           3 Preterm     M Vag-Spont  Y LIV  2 Term     M Vag-Spont  N LIV  1 SAB             Obstetric Comments  Age first menstrual cycle 75  Age first pregnancy 54  IUD 2008     ROS: A ROS was performed and pertinent positives and negatives are included in the history.  GENERAL: No fevers or chills. HEENT: No change in vision, no earache, sore throat or sinus congestion. NECK: No pain or stiffness. CARDIOVASCULAR: No chest pain or pressure. No palpitations. PULMONARY: No shortness of  breath, cough or wheeze. GASTROINTESTINAL: No abdominal pain, nausea, vomiting or diarrhea, melena or bright red blood per rectum. GENITOURINARY: No urinary frequency, urgency, hesitancy or dysuria. MUSCULOSKELETAL: No joint or muscle pain, no back pain, no recent trauma. DERMATOLOGIC: No rash, no itching, no lesions. ENDOCRINE: No polyuria, polydipsia, no heat or cold intolerance. No recent change in weight. HEMATOLOGICAL: No anemia or easy bruising or bleeding. NEUROLOGIC: No headache, seizures, numbness, tingling or weakness. PSYCHIATRIC: No depression, no loss of interest in normal activity or change in sleep pattern.     Exam: chaperone present  BP 124/80   Ht 5\' 6"  (1.676 m)   Wt 189 lb (85.7 kg)   BMI 30.51 kg/m   Body mass index is 30.51 kg/m.  General appearance : Well developed well nourished female. No acute distress HEENT: Eyes: no retinal hemorrhage or exudates,  Neck supple, trachea midline, no carotid bruits, no thyroidmegaly Lungs: Clear to auscultation, no rhonchi or wheezes, or rib retractions  Heart: Regular rate and rhythm, no murmurs or gallops Breast:Examined in sitting and supine position were symmetrical in appearance, no palpable masses or tenderness,  no skin retraction, no nipple inversion, no nipple discharge, no skin discoloration, no axillary or supraclavicular lymphadenopathy Abdomen: no palpable masses or tenderness, no rebound or guarding  Extremities: no edema or skin discoloration or tenderness  Pelvic:  Bartholin, Urethra, Skene Glands: Within normal limits             Vagina: No gross lesions or discharge  Cervix: No gross lesions or discharge, IUD string visualized  Uterus  anteverted, normal size, shape and consistency, non-tender and mobile  Adnexa  Without masses or tenderness  Anus and perineum  normal   Rectovaginal  normal sphincter tone without palpated masses or tenderness             Hemoccult not indicated     Assessment/Plan:  45 y.o.  female for annual exam who was once again counseled on the detrimental effects of smoking. Literature information and tie smoking was provided . The following screening blood work were ordered: Comprehensive metabolic panel, fasting lipid profile, TSH, CBC, and urinalysis. Pap smear not indicated this year.   Terrance Mass MD, 2:26 PM 06/24/2016

## 2016-06-25 LAB — COMPREHENSIVE METABOLIC PANEL
ALT: 14 U/L (ref 6–29)
AST: 14 U/L (ref 10–30)
Albumin: 4.3 g/dL (ref 3.6–5.1)
Alkaline Phosphatase: 46 U/L (ref 33–115)
BUN: 13 mg/dL (ref 7–25)
CHLORIDE: 104 mmol/L (ref 98–110)
CO2: 23 mmol/L (ref 20–31)
CREATININE: 0.84 mg/dL (ref 0.50–1.10)
Calcium: 9.1 mg/dL (ref 8.6–10.2)
Glucose, Bld: 83 mg/dL (ref 65–99)
Potassium: 4.3 mmol/L (ref 3.5–5.3)
SODIUM: 138 mmol/L (ref 135–146)
Total Bilirubin: 0.5 mg/dL (ref 0.2–1.2)
Total Protein: 6.9 g/dL (ref 6.1–8.1)

## 2016-06-25 LAB — URINALYSIS W MICROSCOPIC + REFLEX CULTURE
BILIRUBIN URINE: NEGATIVE
Bacteria, UA: NONE SEEN [HPF]
CRYSTALS: NONE SEEN [HPF]
Casts: NONE SEEN [LPF]
Glucose, UA: NEGATIVE
Leukocytes, UA: NEGATIVE
Nitrite: NEGATIVE
Protein, ur: NEGATIVE
RBC / HPF: NONE SEEN RBC/HPF (ref <=2)
SPECIFIC GRAVITY, URINE: 1.012 (ref 1.001–1.035)
Squamous Epithelial / LPF: NONE SEEN [HPF] (ref <=5)
WBC UA: NONE SEEN WBC/HPF (ref <=5)
Yeast: NONE SEEN [HPF]
pH: 5.5 (ref 5.0–8.0)

## 2016-06-25 LAB — LIPID PANEL
CHOL/HDL RATIO: 3.6 ratio (ref <=5.0)
Cholesterol: 138 mg/dL (ref 125–200)
HDL: 38 mg/dL — ABNORMAL LOW (ref >=46)
LDL CALC: 81 mg/dL (ref <130)
Triglycerides: 97 mg/dL (ref <150)
VLDL: 19 mg/dL (ref <30)

## 2016-06-27 ENCOUNTER — Other Ambulatory Visit: Payer: Self-pay | Admitting: Gynecology

## 2016-06-27 DIAGNOSIS — R319 Hematuria, unspecified: Secondary | ICD-10-CM

## 2016-07-08 ENCOUNTER — Other Ambulatory Visit: Payer: 59

## 2016-07-08 DIAGNOSIS — R319 Hematuria, unspecified: Secondary | ICD-10-CM

## 2016-07-09 LAB — URINALYSIS W MICROSCOPIC + REFLEX CULTURE
BILIRUBIN URINE: NEGATIVE
Bacteria, UA: NONE SEEN [HPF]
CASTS: NONE SEEN [LPF]
CRYSTALS: NONE SEEN [HPF]
Glucose, UA: NEGATIVE
HGB URINE DIPSTICK: NEGATIVE
KETONES UR: NEGATIVE
LEUKOCYTES UA: NEGATIVE
NITRITE: NEGATIVE
Protein, ur: NEGATIVE
RBC / HPF: NONE SEEN RBC/HPF (ref <=2)
Specific Gravity, Urine: 1.008 (ref 1.001–1.035)
Squamous Epithelial / LPF: NONE SEEN [HPF] (ref <=5)
WBC, UA: NONE SEEN WBC/HPF (ref <=5)
Yeast: NONE SEEN [HPF]
pH: 7.5 (ref 5.0–8.0)

## 2016-12-22 ENCOUNTER — Ambulatory Visit (INDEPENDENT_AMBULATORY_CARE_PROVIDER_SITE_OTHER): Payer: 59 | Admitting: Gynecology

## 2016-12-22 ENCOUNTER — Encounter: Payer: Self-pay | Admitting: Gynecology

## 2016-12-22 VITALS — BP 124/80

## 2016-12-22 DIAGNOSIS — Z975 Presence of (intrauterine) contraceptive device: Secondary | ICD-10-CM | POA: Insufficient documentation

## 2016-12-22 DIAGNOSIS — Z30433 Encounter for removal and reinsertion of intrauterine contraceptive device: Secondary | ICD-10-CM | POA: Diagnosis not present

## 2016-12-22 NOTE — Patient Instructions (Signed)

## 2016-12-22 NOTE — Progress Notes (Signed)
   Patient is a 46 year old that presented to the office today to replace her expiring Mirena IUD which was placed in 2013. She has done well with this form of contraception. Patient had her complete gynecological exam November 2017 see previous note for additional details.                                                                    IUD procedure note       Patient presented to the office today for placement of Mirena IUD. The patient had previously been provided with literature information on this method of contraception. The risks benefits and pros and cons were discussed and all her questions were answered. She is fully aware that this form of contraception is 99% effective and is good for 5 years.  Pelvic exam: Bartholin urethra Skene glands: Within normal limits Vagina: No lesions or discharge Cervix: No lesions or discharge Uterus: Anteverted position Adnexa: No masses or tenderness Rectal exam: Not done  The cervix was cleansed with Betadine solution. A single-tooth tenaculum was placed on the anterior cervical lip. The uterus sounded to 7 centimeter. The IUD was shown to the patient and inserted in a sterile fashion. The IUD string was trimmed. The single-tooth tenaculum was removed. Patient was instructed to return back to the office in one month for follow up.       Mirena IUD placed 12/22/2016 good for 5 years lot number [id number]

## 2016-12-26 ENCOUNTER — Encounter: Payer: Self-pay | Admitting: Anesthesiology

## 2017-01-04 ENCOUNTER — Encounter: Payer: Self-pay | Admitting: Gynecology

## 2017-01-31 ENCOUNTER — Encounter: Payer: Self-pay | Admitting: Gynecology

## 2017-01-31 ENCOUNTER — Ambulatory Visit (INDEPENDENT_AMBULATORY_CARE_PROVIDER_SITE_OTHER): Payer: 59 | Admitting: Gynecology

## 2017-01-31 VITALS — BP 126/88 | Wt 174.4 lb

## 2017-01-31 DIAGNOSIS — Z30431 Encounter for routine checking of intrauterine contraceptive device: Secondary | ICD-10-CM | POA: Diagnosis not present

## 2017-01-31 NOTE — Progress Notes (Signed)
   Patient presented to the office for IUD check. On May 3 patient had her Mirena IUD changed from expired one to the new one. Patient is doing well with exception that she's eels that she has become more moody since the new IUD was placed. I've recommended we wait for 3 months before considering switching to a nonhormonal IUD.  Exam: Pelvic: Bartholin urethra Skene was within normal limits Vagina: No lesions or discharge Cervix: IUD string visualized Uterus: Anteverted normal size shape and consistency Adnexa: No palpable masses or tenderness Rectal exam not done  Assessment/plan: Patient one month status post replacement expired Mirena IUD for a new one. Patient to return in October for her annual exam.

## 2017-05-16 ENCOUNTER — Other Ambulatory Visit: Payer: Self-pay

## 2017-05-16 DIAGNOSIS — Z1231 Encounter for screening mammogram for malignant neoplasm of breast: Secondary | ICD-10-CM

## 2017-06-23 ENCOUNTER — Ambulatory Visit
Admission: RE | Admit: 2017-06-23 | Discharge: 2017-06-23 | Disposition: A | Discharge status: Home / Self Care | Payer: 59 | Source: Ambulatory Visit | Attending: General Surgery | Admitting: General Surgery

## 2017-06-23 DIAGNOSIS — Z1231 Encounter for screening mammogram for malignant neoplasm of breast: Secondary | ICD-10-CM | POA: Insufficient documentation

## 2017-07-06 ENCOUNTER — Encounter: Payer: Self-pay | Admitting: *Deleted

## 2017-07-06 ENCOUNTER — Telehealth: Payer: Self-pay | Admitting: *Deleted

## 2017-07-06 MED ORDER — LEVOTHYROXINE SODIUM 112 MCG PO TABS: 112.0000 ug | ORAL_TABLET | Freq: Every day | ORAL | 1 refills | Status: DC

## 2017-07-06 NOTE — Telephone Encounter (Signed)
Pt called had to rescheduled annual due to death in family, annual now scheduled on 09/01/17. Needs refill on synthroid 112 mcg. Rx sent.

## 2017-07-06 NOTE — Addendum Note (Signed)
Addended by: Thamas Jaegers on: 07/06/2017 10:46 AM   Modules accepted: Orders

## 2017-07-07 ENCOUNTER — Encounter: Payer: 59 | Admitting: Obstetrics & Gynecology

## 2017-07-12 ENCOUNTER — Ambulatory Visit (INDEPENDENT_AMBULATORY_CARE_PROVIDER_SITE_OTHER): Payer: 59 | Admitting: General Surgery

## 2017-07-12 ENCOUNTER — Encounter: Payer: Self-pay | Admitting: General Surgery

## 2017-07-12 VITALS — BP 126/78 | HR 74 | Resp 12 | Ht 67.0 in | Wt 185.0 lb

## 2017-07-12 DIAGNOSIS — Z803 Family history of malignant neoplasm of breast: Secondary | ICD-10-CM | POA: Diagnosis not present

## 2017-07-12 DIAGNOSIS — Z86018 Personal history of other benign neoplasm: Secondary | ICD-10-CM | POA: Diagnosis not present

## 2017-07-12 NOTE — Patient Instructions (Signed)
Patient will be asked to return to the office in one year with a bilateral screening mammogram with Dr. Bary Castilla. The patient is aware to call back for any questions or concerns.

## 2017-07-12 NOTE — Progress Notes (Signed)
Patient ID: Amy Peters, female   DOB: [birth year], 46 y.o.   MRN: [id number]  Chief Complaint  Patient presents with  . Follow-up    HPI Amy Peters is a 46 y.o. female who presents for a breast evaluation. The most recent mammogram was done on 07/07/2017.  Patient does perform regular self breast checks and gets regular mammograms done.  History of fibroadenoma   Excision in 2013. FH of breast cancer.      HPI  Past Medical History:  Diagnosis Date  . Anxiety   . Breast fibroadenoma    LEFT  . Bronchitis   . Depression   . Hypothyroid 2005  . IUFD (intrauterine fetal death)    @ 83 WEEKS  . Personal history of tobacco use, presenting hazards to health     Past Surgical History:  Procedure Laterality Date  . BREAST EXCISIONAL BIOPSY Left 07/13/2012   neg  . BREAST EXCISIONAL BIOPSY Left 2011   neg  . BREAST SURGERY Left 2009, 2013   excision left breast lesion  . DILATION AND CURETTAGE OF UTERUS  1996  . [place], 2002  . HYSTEROSCOPY  2007   HYST., D&C/POLYP  . INTRAUTERINE DEVICE INSERTION  11/2011   Mirena  . PILONIDAL CYST EXCISION  1994  . RADIOACTIVE IODINE ABLATION  12-05-2003   OF [id number] MILLICURIES OF [id number]. BALAN    Family History  Problem Relation Age of Onset  . Breast cancer Mother 68       01/2010  . Hypertension Father   . Cancer Maternal Grandfather        colon    Social History Social History   Tobacco Use  . Smoking status: Former Smoker    Years: 15.00    Types: Cigarettes    Last attempt to quit: 05/12/2017    Years since quitting: 0.1  . Smokeless tobacco: Never Used  Substance Use Topics  . Alcohol use: Yes    Alcohol/week: 0.0 oz    Comment: Socially  . Drug use: No    No Known Allergies  Current Outpatient Medications  Medication Sig Dispense Refill  . Calcium Carbonate-Vitamin D (CALCIUM + D PO) Take by mouth every other day.     . levonorgestrel (MIRENA) 20 MCG/24HR  IUD by Intrauterine route.    Marland Kitchen levothyroxine (SYNTHROID, LEVOTHROID) 112 MCG tablet Take 1 tablet (112 mcg total) daily before breakfast by mouth. 30 tablet 1   No current facility-administered medications for this visit.     Review of Systems Review of Systems  Constitutional: Negative.   Respiratory: Negative.   Cardiovascular: Negative.     Blood pressure 126/78, pulse 74, resp. rate 12, height 5\' 7"  (1.702 m), weight 185 lb (83.9 kg).  Physical Exam Physical Exam  Constitutional: She appears well-developed and well-nourished.  Eyes: Conjunctivae are normal. No scleral icterus.  Neck: Neck supple. No thyromegaly present.  Cardiovascular: Normal rate, regular rhythm and normal heart sounds.  Pulmonary/Chest: Effort normal and breath sounds normal. Right breast exhibits no inverted nipple, no mass, no nipple discharge, no skin change and no tenderness. Left breast exhibits no inverted nipple, no mass, no nipple discharge, no skin change and no tenderness. Breasts are symmetrical.  Abdominal: Soft. Bowel sounds are normal. She exhibits no mass. There is no tenderness.  Lymphadenopathy:    She has no cervical adenopathy.    Data Reviewed Mammogram reviewed, no suspicious findings.   Assessment  Stable exam, history of fibroadenoma s/p complete excision and FH of breast cancer and remote FH ofcolon cancer.        Plan       Patient will be asked to return to the office in one year with a bilateral screening mammogram with Dr. Bary Castilla. The patient is aware to call back for any questions or concerns.   HPI, Physical Exam, Assessment and Plan have been scribed under the direction and in the presence of Mckinley Jewel, MD  Gaspar Cola, CMA   I have completed the exam and reviewed the above documentation for accuracy and completeness.  I agree with the above.  Haematologist has been used and any errors in dictation or transcription are  unintentional.  Seeplaputhur G. Jamal Collin, M.D., F.A.C.S.        Junie Panning G 07/18/2017, 9:21 AM

## 2017-09-01 ENCOUNTER — Encounter: Payer: Self-pay | Admitting: Obstetrics & Gynecology

## 2017-09-01 ENCOUNTER — Ambulatory Visit (INDEPENDENT_AMBULATORY_CARE_PROVIDER_SITE_OTHER): Payer: 59 | Admitting: Obstetrics & Gynecology

## 2017-09-01 VITALS — BP 132/80 | Ht 66.0 in | Wt 193.0 lb

## 2017-09-01 DIAGNOSIS — R4586 Emotional lability: Secondary | ICD-10-CM | POA: Diagnosis not present

## 2017-09-01 DIAGNOSIS — Z1151 Encounter for screening for human papillomavirus (HPV): Secondary | ICD-10-CM

## 2017-09-01 DIAGNOSIS — Z30431 Encounter for routine checking of intrauterine contraceptive device: Secondary | ICD-10-CM | POA: Diagnosis not present

## 2017-09-01 DIAGNOSIS — E6609 Other obesity due to excess calories: Secondary | ICD-10-CM

## 2017-09-01 DIAGNOSIS — Z01419 Encounter for gynecological examination (general) (routine) without abnormal findings: Secondary | ICD-10-CM | POA: Diagnosis not present

## 2017-09-01 DIAGNOSIS — Z6831 Body mass index (BMI) 31.0-31.9, adult: Secondary | ICD-10-CM

## 2017-09-01 NOTE — Addendum Note (Signed)
Addended by: Thurnell Garbe A on: 09/01/2017 04:30 PM   Modules accepted: Orders

## 2017-09-01 NOTE — Patient Instructions (Signed)
1. Encounter for routine gynecological examination with Papanicolaou smear of cervix Normal gynecologic exam.  Pap with high-risk HPV done.  Breast exam normal.  Last screening mammogram November 2018 was negative.  Health labs here today. - CBC - Comp Met (CMET) - Lipid panel - TSH - Vitamin D 1,25 dihydroxy  2. Encounter for routine checking of intrauterine contraceptive device (IUD) Mirena IUD inserted May 2018.  Strings visible.  Well-tolerated.  3. Mood swings Mood swings but no evidence of major depression.  Probably associated with the recent success in quitting smoking.  Encouraged to continue to exercise regularly to release her endorphins which will help stabilizing her moods.  4. Class 1 obesity due to excess calories without serious comorbidity with body mass index (BMI) of 31.0 to 31.9 in adult [place] for low calorie/low carbs.  Aerobic physical activity at least 5 times a week and weightlifting every 2 days recommended.  Amy Peters, it was a pleasure meeting you today!  I will inform you of your results as soon as they are available.  Congratulations on quitting smoking!  Health Maintenance, Female Adopting a healthy lifestyle and getting preventive care can go a long way to promote health and wellness. Talk with your health care provider about what schedule of regular examinations is right for you. This is a good chance for you to check in with your provider about disease prevention and staying healthy. In between checkups, there are plenty of things you can do on your own. Experts have done a lot of research about which lifestyle changes and preventive measures are most likely to keep you healthy. Ask your health care provider for more information. Weight and diet Eat a healthy diet  Be sure to include plenty of vegetables, fruits, low-fat dairy products, and lean protein.  Do not eat a lot of foods high in solid fats, added sugars, or salt.  Get regular  exercise. This is one of the most important things you can do for your health. ? Most adults should exercise for at least 150 minutes each week. The exercise should increase your heart rate and make you sweat (moderate-intensity exercise). ? Most adults should also do strengthening exercises at least twice a week. This is in addition to the moderate-intensity exercise.  Maintain a healthy weight  Body mass index (BMI) is a measurement that can be used to identify possible weight problems. It estimates body fat based on height and weight. Your health care provider can help determine your BMI and help you achieve or maintain a healthy weight.  For females 59 years of age and older: ? A BMI below 18.5 is considered underweight. ? A BMI of 18.5 to 24.9 is normal. ? A BMI of 25 to 29.9 is considered overweight. ? A BMI of 30 and above is considered obese.  Watch levels of cholesterol and blood lipids  You should start having your blood tested for lipids and cholesterol at 46 years of age, then have this test every 5 years.  You may need to have your cholesterol levels checked more often if: ? Your lipid or cholesterol levels are high. ? You are older than 46 years of age. ? You are at high risk for heart disease.  Cancer screening Lung Cancer  Lung cancer screening is recommended for adults 81-29 years old who are at high risk for lung cancer because of a history of smoking.  A yearly low-dose CT scan of the lungs is recommended for people  who: ? Currently smoke. ? Have quit within the past 15 years. ? Have at least a 30-pack-year history of smoking. A pack year is smoking an average of one pack of cigarettes a day for 1 year.  Yearly screening should continue until it has been 15 years since you quit.  Yearly screening should stop if you develop a health problem that would prevent you from having lung cancer treatment.  Breast Cancer  Practice breast self-awareness. This means  understanding how your breasts normally appear and feel.  It also means doing regular breast self-exams. Let your health care provider know about any changes, no matter how small.  If you are in your 20s or 30s, you should have a clinical breast exam (CBE) by a health care provider every 1-3 years as part of a regular health exam.  If you are 11 or older, have a CBE every year. Also consider having a breast X-ray (mammogram) every year.  If you have a family history of breast cancer, talk to your health care provider about genetic screening.  If you are at high risk for breast cancer, talk to your health care provider about having an MRI and a mammogram every year.  Breast cancer gene (BRCA) assessment is recommended for women who have family members with BRCA-related cancers. BRCA-related cancers include: ? Breast. ? Ovarian. ? Tubal. ? Peritoneal cancers.  Results of the assessment will determine the need for genetic counseling and BRCA1 and BRCA2 testing.  Cervical Cancer Your health care provider may recommend that you be screened regularly for cancer of the pelvic organs (ovaries, uterus, and vagina). This screening involves a pelvic examination, including checking for microscopic changes to the surface of your cervix (Pap test). You may be encouraged to have this screening done every 3 years, beginning at age 13.  For women ages 55-65, health care providers may recommend pelvic exams and Pap testing every 3 years, or they may recommend the Pap and pelvic exam, combined with testing for human papilloma virus (HPV), every 5 years. Some types of HPV increase your risk of cervical cancer. Testing for HPV may also be done on women of any age with unclear Pap test results.  Other health care providers may not recommend any screening for nonpregnant women who are considered low risk for pelvic cancer and who do not have symptoms. Ask your health care provider if a screening pelvic exam is  right for you.  If you have had past treatment for cervical cancer or a condition that could lead to cancer, you need Pap tests and screening for cancer for at least 20 years after your treatment. If Pap tests have been discontinued, your risk factors (such as having a new sexual partner) need to be reassessed to determine if screening should resume. Some women have medical problems that increase the chance of getting cervical cancer. In these cases, your health care provider may recommend more frequent screening and Pap tests.  Colorectal Cancer  This type of cancer can be detected and often prevented.  Routine colorectal cancer screening usually begins at 47 years of age and continues through 46 years of age.  Your health care provider may recommend screening at an earlier age if you have risk factors for colon cancer.  Your health care provider may also recommend using home test kits to check for hidden blood in the stool.  A small camera at the end of a tube can be used to examine your colon directly (sigmoidoscopy  or colonoscopy). This is done to check for the earliest forms of colorectal cancer.  Routine screening usually begins at age 72.  Direct examination of the colon should be repeated every 5-10 years through 46 years of age. However, you may need to be screened more often if early forms of precancerous polyps or small growths are found.  Skin Cancer  Check your skin from head to toe regularly.  Tell your health care provider about any new moles or changes in moles, especially if there is a change in a mole's shape or color.  Also tell your health care provider if you have a mole that is larger than the size of a pencil eraser.  Always use sunscreen. Apply sunscreen liberally and repeatedly throughout the day.  Protect yourself by wearing long sleeves, pants, a wide-brimmed hat, and sunglasses whenever you are outside.  Heart disease, diabetes, and high blood  pressure  High blood pressure causes heart disease and increases the risk of stroke. High blood pressure is more likely to develop in: ? People who have blood pressure in the high end of the normal range (130-139/85-89 mm Hg). ? People who are overweight or obese. ? People who are African American.  If you are 17-1 years of age, have your blood pressure checked every 3-5 years. If you are 85 years of age or older, have your blood pressure checked every year. You should have your blood pressure measured twice-once when you are at a hospital or clinic, and once when you are not at a hospital or clinic. Record the average of the two measurements. To check your blood pressure when you are not at a hospital or clinic, you can use: ? An automated blood pressure machine at a pharmacy. ? A home blood pressure monitor.  If you are between 67 years and 78 years old, ask your health care provider if you should take aspirin to prevent strokes.  Have regular diabetes screenings. This involves taking a blood sample to check your fasting blood sugar level. ? If you are at a normal weight and have a low risk for diabetes, have this test once every three years after 46 years of age. ? If you are overweight and have a high risk for diabetes, consider being tested at a younger age or more often. Preventing infection Hepatitis B  If you have a higher risk for hepatitis B, you should be screened for this virus. You are considered at high risk for hepatitis B if: ? You were born in a country where hepatitis B is common. Ask your health care provider which countries are considered high risk. ? Your parents were born in a high-risk country, and you have not been immunized against hepatitis B (hepatitis B vaccine). ? You have HIV or AIDS. ? You use needles to inject street drugs. ? You live with someone who has hepatitis B. ? You have had sex with someone who has hepatitis B. ? You get hemodialysis  treatment. ? You take certain medicines for conditions, including cancer, organ transplantation, and autoimmune conditions.  Hepatitis C  Blood testing is recommended for: ? Everyone born from [birth year] through 1964. ? Anyone with known risk factors for hepatitis C.  Sexually transmitted infections (STIs)  You should be screened for sexually transmitted infections (STIs) including gonorrhea and chlamydia if: ? You are sexually active and are younger than 46 years of age. ? You are older than 46 years of age and your health care provider tells  you that you are at risk for this type of infection. ? Your sexual activity has changed since you were last screened and you are at an increased risk for chlamydia or gonorrhea. Ask your health care provider if you are at risk.  If you do not have HIV, but are at risk, it may be recommended that you take a prescription medicine daily to prevent HIV infection. This is called pre-exposure prophylaxis (PrEP). You are considered at risk if: ? You are sexually active and do not regularly use condoms or know the HIV status of your partner(s). ? You take drugs by injection. ? You are sexually active with a partner who has HIV.  Talk with your health care provider about whether you are at high risk of being infected with HIV. If you choose to begin PrEP, you should first be tested for HIV. You should then be tested every 3 months for as long as you are taking PrEP. Pregnancy  If you are premenopausal and you may become pregnant, ask your health care provider about preconception counseling.  If you may become pregnant, take 400 to 800 micrograms (mcg) of folic acid every day.  If you want to prevent pregnancy, talk to your health care provider about birth control (contraception). Osteoporosis and menopause  Osteoporosis is a disease in which the bones lose minerals and strength with aging. This can result in serious bone fractures. Your risk for osteoporosis  can be identified using a bone density scan.  If you are 57 years of age or older, or if you are at risk for osteoporosis and fractures, ask your health care provider if you should be screened.  Ask your health care provider whether you should take a calcium or vitamin D supplement to lower your risk for osteoporosis.  Menopause may have certain physical symptoms and risks.  Hormone replacement therapy may reduce some of these symptoms and risks. Talk to your health care provider about whether hormone replacement therapy is right for you. Follow these instructions at home:  Schedule regular health, dental, and eye exams.  Stay current with your immunizations.  Do not use any tobacco products including cigarettes, chewing tobacco, or electronic cigarettes.  If you are pregnant, do not drink alcohol.  If you are breastfeeding, limit how much and how often you drink alcohol.  Limit alcohol intake to no more than 1 drink per day for nonpregnant women. One drink equals 12 ounces of beer, 5 ounces of wine, or 1 ounces of hard liquor.  Do not use street drugs.  Do not share needles.  Ask your health care provider for help if you need support or information about quitting drugs.  Tell your health care provider if you often feel depressed.  Tell your health care provider if you have ever been abused or do not feel safe at home. This information is not intended to replace advice given to you by your health care provider. Make sure you discuss any questions you have with your health care provider. Document Released: 02/21/2011 Document Revised: 01/14/2016 Document Reviewed: 05/12/2015 Elsevier Interactive Patient Education  [employer].

## 2017-09-01 NOTE — Progress Notes (Signed)
Amy Peters 01/13/1971 [id number]   History:    47 y.o. [id number] Married.  Children 30 and 66 yo.  RP:  Established patient presenting for annual gyn exam   HPI:  Quit smoking x 10 weeks!  Has caused mood swings.  No evidence of major depression.  No suicidal ideation.  Well on new Mirena IUD since May 2018.  Occasional light menstrual flow.  No pelvic pain.  Normal vaginal secretions.  Breasts normal.  Urine and bowel movements normal.  Fasting today for health labs here.  Past medical history,surgical history, family history and social history were all reviewed and documented in the EPIC chart.  Gynecologic History No LMP recorded. Patient is not currently having periods (Reason: IUD). Contraception: IUD Last Pap: 2015. Results were: normal Last mammogram: 06/2017. Results were: Negative  Obstetric History OB History  Gravida Para Term Preterm AB Living  '4 2 1 1 2 2  ' SAB TAB Ectopic Multiple Live Births  2       2    # Outcome Date GA Lbr Len/2nd Weight Sex Delivery Anes PTL Lv  4 SAB           3 Preterm     M Vag-Spont  Y LIV  2 Term     M Vag-Spont  N LIV  1 SAB             Obstetric Comments  Age first menstrual cycle 36  Age first pregnancy 75  IUD 2008     ROS: A ROS was performed and pertinent positives and negatives are included in the history.  GENERAL: No fevers or chills. HEENT: No change in vision, no earache, sore throat or sinus congestion. NECK: No pain or stiffness. CARDIOVASCULAR: No chest pain or pressure. No palpitations. PULMONARY: No shortness of breath, cough or wheeze. GASTROINTESTINAL: No abdominal pain, nausea, vomiting or diarrhea, melena or bright red blood per rectum. GENITOURINARY: No urinary frequency, urgency, hesitancy or dysuria. MUSCULOSKELETAL: No joint or muscle pain, no back pain, no recent trauma. DERMATOLOGIC: No rash, no itching, no lesions. ENDOCRINE: No polyuria, polydipsia, no heat or cold intolerance. No recent change in  weight. HEMATOLOGICAL: No anemia or easy bruising or bleeding. NEUROLOGIC: No headache, seizures, numbness, tingling or weakness. PSYCHIATRIC: No depression, no loss of interest in normal activity or change in sleep pattern.     Exam:   BP 132/80   Ht '5\' 6"'  (1.676 m)   Wt 193 lb (87.5 kg)   BMI 31.15 kg/m   Body mass index is 31.15 kg/m.  General appearance : Well developed well nourished female. No acute distress HEENT: Eyes: no retinal hemorrhage or exudates,  Neck supple, trachea midline, no carotid bruits, no thyroidmegaly Lungs: Clear to auscultation, no rhonchi or wheezes, or rib retractions  Heart: Regular rate and rhythm, no murmurs or gallops Breast:Examined in sitting and supine position were symmetrical in appearance, no palpable masses or tenderness,  no skin retraction, no nipple inversion, no nipple discharge, no skin discoloration, no axillary or supraclavicular lymphadenopathy Abdomen: no palpable masses or tenderness, no rebound or guarding Extremities: no edema or skin discoloration or tenderness  Pelvic: Vulva normal  B[place]olin, Urethra, Skene Glands: Within normal limits             Vagina: No gross lesions or discharge  Cervix: No gross lesions or discharge.  IUD strings seen.  Pap/HPV HR done.  Uterus  AV, normal size, shape and consistency, non-tender and mobile  Adnexa  Without masses or tenderness  Anus and perineum  normal    Assessment/Plan:  47 y.o. female for annual exam   1. Encounter for routine gynecological examination with Papanicolaou smear of cervix Normal gynecologic exam.  Pap with high-risk HPV done.  Breast exam normal.  Last screening mammogram November 2018 was negative.  Health labs here today. - CBC - Comp Met (CMET) - Lipid panel - TSH - Vitamin D 1,25 dihydroxy  2. Encounter for routine checking of intrauterine contraceptive device (IUD) Mirena IUD inserted May 2018.  Strings visible.  Well-tolerated.  3. Mood swings Mood  swings but no evidence of major depression.  Probably associated with the recent success in quitting smoking.  Encouraged to continue to exercise regularly to release her endorphins which will help stabilizing her moods.  4. Class 1 obesity due to excess calories without serious comorbidity with body mass index (BMI) of 31.0 to 31.9 in adult [place] for low calorie/low carbs.  Aerobic physical activity at least 5 times a week and weightlifting every 2 days recommended.  Princess Bruins MD, 2:06 PM 09/01/2017

## 2017-09-05 LAB — PAP, TP IMAGING W/ HPV RNA, RFLX HPV TYPE 16,18/45: HPV DNA HIGH RISK: NOT DETECTED

## 2017-09-05 LAB — CBC
HEMATOCRIT: 38 % (ref 35.0–45.0)
HEMOGLOBIN: 12.8 g/dL (ref 11.7–15.5)
MCH: 31.5 pg (ref 27.0–33.0)
MCHC: 33.7 g/dL (ref 32.0–36.0)
MCV: 93.6 fL (ref 80.0–100.0)
MPV: 11.7 fL (ref 7.5–12.5)
PLATELETS: 252 10*3/uL (ref 140–400)
RBC: 4.06 10*6/uL (ref 3.80–5.10)
RDW: 11.8 % (ref 11.0–15.0)
WBC: 7.9 10*3/uL (ref 3.8–10.8)

## 2017-09-05 LAB — TEST AUTHORIZATION

## 2017-09-05 LAB — LIPID PANEL
CHOLESTEROL: 153 mg/dL (ref <200)
HDL: 57 mg/dL (ref >50)
LDL Cholesterol (Calc): 83 mg/dL (calc)
Non-HDL Cholesterol (Calc): 96 mg/dL (calc) (ref <130)
Total CHOL/HDL Ratio: 2.7 (calc) (ref <5.0)
Triglycerides: 58 mg/dL (ref <150)

## 2017-09-05 LAB — T4, FREE: Free T4: 1.4 ng/dL (ref 0.8–1.8)

## 2017-09-05 LAB — VITAMIN D 1,25 DIHYDROXY
Vitamin D 1, 25 (OH)2 Total: 42 pg/mL (ref 18–72)
Vitamin D2 1, 25 (OH)2: <8 pg/mL
Vitamin D3 1, 25 (OH)2: 42 pg/mL

## 2017-09-05 LAB — COMPREHENSIVE METABOLIC PANEL
AG RATIO: 1.7 (calc) (ref 1.0–2.5)
ALBUMIN MSPROF: 4.5 g/dL (ref 3.6–5.1)
ALKALINE PHOSPHATASE (APISO): 50 U/L (ref 33–115)
ALT: 27 U/L (ref 6–29)
AST: 23 U/L (ref 10–35)
BILIRUBIN TOTAL: 0.6 mg/dL (ref 0.2–1.2)
BUN: 17 mg/dL (ref 7–25)
CHLORIDE: 102 mmol/L (ref 98–110)
CO2: 27 mmol/L (ref 20–32)
CREATININE: 0.8 mg/dL (ref 0.50–1.10)
Calcium: 9.4 mg/dL (ref 8.6–10.2)
GLOBULIN: 2.6 g/dL (ref 1.9–3.7)
Glucose, Bld: 79 mg/dL (ref 65–99)
POTASSIUM: 4.4 mmol/L (ref 3.5–5.3)
Sodium: 137 mmol/L (ref 135–146)
Total Protein: 7.1 g/dL (ref 6.1–8.1)

## 2017-09-05 LAB — T3, FREE: T3, Free: 2.4 pg/mL (ref 2.3–4.2)

## 2017-09-05 LAB — TSH: TSH: 4.92 mIU/L — ABNORMAL HIGH

## 2017-09-07 ENCOUNTER — Other Ambulatory Visit: Payer: Self-pay | Admitting: Obstetrics & Gynecology

## 2017-09-07 NOTE — Telephone Encounter (Signed)
Dr. Marguerita Merles- Patient just had TSH checked on 09/01/17 and you added FT3 and FT4. Appears those were normal. Please advise regarding refill .

## 2017-09-11 ENCOUNTER — Telehealth: Payer: Self-pay | Admitting: *Deleted

## 2017-09-11 MED ORDER — LEVOTHYROXINE SODIUM 125 MCG PO TABS: 125.0000 ug | ORAL_TABLET | Freq: Every day | ORAL | 0 refills | Status: AC

## 2017-09-11 NOTE — Telephone Encounter (Signed)
Dr. Dellis Filbert you wanted the lab to add T3& T4 results based on recent TSH mildly increased. Should patient continue on same dose of synthroid 112 mcg? Please advise

## 2017-09-11 NOTE — Telephone Encounter (Signed)
Increase to Levothyroixine 125 mcg daily.  Schedule with Fam MD to further manage.

## 2017-09-11 NOTE — Telephone Encounter (Signed)
Pt aware, Rx sent, 90 day supply enough for pt to get established with PCP, she doesn't  have one currently.

## 2018-05-18 ENCOUNTER — Other Ambulatory Visit: Payer: Self-pay

## 2018-05-18 DIAGNOSIS — Z1231 Encounter for screening mammogram for malignant neoplasm of breast: Secondary | ICD-10-CM

## 2018-06-01 ENCOUNTER — Encounter: Payer: Self-pay | Admitting: Obstetrics & Gynecology

## 2018-06-01 ENCOUNTER — Ambulatory Visit: Payer: 59 | Admitting: Obstetrics & Gynecology

## 2018-06-01 VITALS — BP 126/84

## 2018-06-01 DIAGNOSIS — Z30431 Encounter for routine checking of intrauterine contraceptive device: Secondary | ICD-10-CM | POA: Diagnosis not present

## 2018-06-01 DIAGNOSIS — R3121 Asymptomatic microscopic hematuria: Secondary | ICD-10-CM

## 2018-06-01 DIAGNOSIS — R102 Pelvic and perineal pain: Secondary | ICD-10-CM

## 2018-06-01 DIAGNOSIS — Z23 Encounter for immunization: Secondary | ICD-10-CM | POA: Diagnosis not present

## 2018-06-01 MED ORDER — CIPROFLOXACIN HCL 500 MG PO TABS: 500.0000 mg | ORAL_TABLET | Freq: Two times a day (BID) | ORAL | 0 refills | Status: AC

## 2018-06-01 NOTE — Progress Notes (Signed)
    Amy Peters June 12, 1971 [id number]        47 y.o.  [id number] Married  RP: Midline pelvic pain/cramping x 4 days  HPI: Midline pelvic pain/cramping x 4 days.  Mirena IUD in place x 12/2016.  No breakthrough bleeding.  No abnormal vaginal discharge.  Declines STD screening.  Urine normal, no frequency or pain with urination.  Patient did not notice blood in urine.  Bowel movements normal.  No fever.   OB History  Gravida Para Term Preterm AB Living  4 2 1 1 2 2   SAB TAB Ectopic Multiple Live Births  2       2    # Outcome Date GA Lbr Len/2nd Weight Sex Delivery Anes PTL Lv  4 SAB           3 Preterm     M Vag-Spont  Y LIV  2 Term     M Vag-Spont  N LIV  1 SAB             Obstetric Comments  Age first menstrual cycle 40  Age first pregnancy 34  IUD 2008    Past medical history,surgical history, problem list, medications, allergies, family history and social history were all reviewed and documented in the EPIC chart.   Directed ROS with pertinent positives and negatives documented in the history of present illness/assessment and plan.  Exam:  Vitals:   06/01/18 0842  BP: 126/84   General appearance:  Normal  Abdomen: Normal  Gynecologic exam: Vulva normal.  Speculum: Cervix and vagina normal.  IUD strings visible at the exocervix, no IUD part visible.  Mild brownish spotting.  No vaginal discharge.  Bimanual exam: Cervix nontender to mobilization.  Uterus anteverted, normal volume, mobile, mildly tender with the abdominal hand.  No adnexal mass, nontender bilaterally.  U/A: Dark yellow clear, nitrites negative, white blood cells 0-5, red blood cells 10-20, few bacteria.  Urine culture pending.   Assessment/Plan:  47 y.o. [id number]   1. Pelvic pain in female Midline crampy pelvic pain.  Had a little bit of spotting on speculum exam.  No evidence of infection and IUD appeared in normal position.  Follow-up for a pelvic ultrasound to confirm a completely normal IUD  position and rule out uterine/ovarian pathology.  Will take ibuprofen regularly for crampy pain until follow-up pelvic ultrasound. - US Transvaginal Non-OB; Future  2. Asymptomatic microscopic hematuria Significant blood in urine with red blood cells 10-20 per field.  Possible hemorrhagic cystitis.  Decision to treat with ciprofloxacin 500 mg tablet twice a day for 7 days.  Usage reviewed and prescription sent to pharmacy.  Pending urine culture.  If urine culture is negative, will repeat urine analysis at follow-up.  If persistent hematuria will refer to urology.  3. Encounter for routine checking of intrauterine contraceptive device (IUD) IUD strings visible at the exocervix.  Will confirm placement by ultrasound at follow-up. - US Transvaginal Non-OB; Future  Counseling on above issues and coordination of care more than 50% for 25 minutes.  Other orders - ibuprofen (ADVIL,MOTRIN) 200 MG tablet; Take 200 mg by mouth every 6 (six) hours as needed. - ciprofloxacin (CIPRO) 500 MG tablet; Take 1 tablet (500 mg total) by mouth 2 (two) times daily for 7 days.  Princess Bruins MD, 8:50 AM 06/01/2018

## 2018-06-01 NOTE — Patient Instructions (Signed)
1. Pelvic pain in female Midline crampy pelvic pain.  Had a little bit of spotting on speculum exam.  No evidence of infection and IUD appeared in normal position.  Follow-up for a pelvic ultrasound to confirm a completely normal IUD position and rule out uterine/ovarian pathology.  Will take ibuprofen regularly for crampy pain until follow-up pelvic ultrasound. - US Transvaginal Non-OB; Future  2. Asymptomatic microscopic hematuria Significant blood in urine with red blood cells 10-20 per field.  Possible hemorrhagic cystitis.  Decision to treat with ciprofloxacin 500 mg tablet twice a day for 7 days.  Usage reviewed and prescription sent to pharmacy.  Pending urine culture.  If urine culture is negative, will repeat urine analysis at follow-up.  If persistent hematuria will refer to urology.  3. Encounter for routine checking of intrauterine contraceptive device (IUD) IUD strings visible at the exocervix.  Will confirm placement by ultrasound at follow-up. - US Transvaginal Non-OB; Future  Counseling on above issues and coordination of care more than 50% for 25 minutes.  Other orders - ibuprofen (ADVIL,MOTRIN) 200 MG tablet; Take 200 mg by mouth every 6 (six) hours as needed. - ciprofloxacin (CIPRO) 500 MG tablet; Take 1 tablet (500 mg total) by mouth 2 (two) times daily for 7 days.  Rudy, it was a pleasure seeing you today!

## 2018-06-03 LAB — URINALYSIS, COMPLETE W/RFL CULTURE
BILIRUBIN URINE: NEGATIVE
GLUCOSE, UA: NEGATIVE
Hyaline Cast: NONE SEEN /LPF
Ketones, ur: NEGATIVE
LEUKOCYTE ESTERASE: NEGATIVE
NITRITES URINE, INITIAL: NEGATIVE
PH: 5.5 (ref 5.0–8.0)
Protein, ur: NEGATIVE
SPECIFIC GRAVITY, URINE: 1.015 (ref 1.001–1.03)

## 2018-06-03 LAB — URINE CULTURE
MICRO NUMBER:: 91230194
RESULT: NO GROWTH
SPECIMEN QUALITY:: ADEQUATE

## 2018-06-03 LAB — CULTURE INDICATED

## 2018-06-05 ENCOUNTER — Other Ambulatory Visit: Payer: Self-pay | Admitting: Obstetrics & Gynecology

## 2018-06-05 ENCOUNTER — Ambulatory Visit: Payer: 59 | Admitting: Obstetrics & Gynecology

## 2018-06-05 ENCOUNTER — Ambulatory Visit (INDEPENDENT_AMBULATORY_CARE_PROVIDER_SITE_OTHER): Payer: 59

## 2018-06-05 DIAGNOSIS — N83201 Unspecified ovarian cyst, right side: Secondary | ICD-10-CM | POA: Diagnosis not present

## 2018-06-05 DIAGNOSIS — R102 Pelvic and perineal pain: Secondary | ICD-10-CM

## 2018-06-05 DIAGNOSIS — N838 Other noninflammatory disorders of ovary, fallopian tube and broad ligament: Secondary | ICD-10-CM

## 2018-06-05 DIAGNOSIS — N83202 Unspecified ovarian cyst, left side: Secondary | ICD-10-CM | POA: Diagnosis not present

## 2018-06-05 DIAGNOSIS — Z30431 Encounter for routine checking of intrauterine contraceptive device: Secondary | ICD-10-CM | POA: Diagnosis not present

## 2018-06-05 NOTE — Progress Notes (Signed)
    Amy Peters 24-Sep-1970 [id number]        47 y.o.  [id number]  Married  RP: Midline pelvic pain for Pelvic US  HPI: On Mirena IUD since May 2018.  Resolved pelvic pain and vaginal spotting.   OB History  Gravida Para Term Preterm AB Living  4 2 1 1 2 2   SAB TAB Ectopic Multiple Live Births  2       2    # Outcome Date GA Lbr Len/2nd Weight Sex Delivery Anes PTL Lv  4 SAB           3 Preterm     M Vag-Spont  Y LIV  2 Term     M Vag-Spont  N LIV  1 SAB             Obstetric Comments  Age first menstrual cycle 30  Age first pregnancy 86  IUD 2008    Past medical history,surgical history, problem list, medications, allergies, family history and social history were all reviewed and documented in the EPIC chart.   Directed ROS with pertinent positives and negatives documented in the history of present illness/assessment and plan.  Exam:  There were no vitals filed for this visit. General appearance:  Normal  Pelvic US today: T/V images.  Anteverted uterus measuring 8.74 x 5.06 x 4.40 cm.  Normal endometrial line at 2.8 mm.  IUD seen in normal intrauterine position.  Right ovary with a thin-walled cyst measuring 1.5 x 1.6 cm.  Internal echoes with thin anterior wall septations.  Arterial blood flow present at the center.  Left ovary with a thick-walled partly solid cyst measuring 1.3 x 1.3 cm.  Negative color flow Doppler.  No free fluid in the posterior cul-de-sac.   Assessment/Plan:  47 y.o. G4P112  1. Pelvic pain in female Resolved.  2. Cysts of both ovaries Very small bilateral ovarian cysts measuring 1.5 x 1.6 cm on the right with thin anterior wall septations and 1.3 x 1.3 on the left with a thick wall and both cystic and solid content.  Given those findings, will follow up with a repeat pelvic ultrasound in 2-3 months.  Ca125 and CEA drawn today. - CA 125 - CEA - US Transvaginal Non-OB; Future  Counseling on above issues and coordination of care more than 50%  for 15 minutes.  Princess Bruins MD, 9:15 AM 06/05/2018

## 2018-06-06 LAB — CEA: CEA: 0.8 ng/mL

## 2018-06-06 LAB — CA 125: CA 125: 12 U/mL (ref <35)

## 2018-06-10 ENCOUNTER — Encounter: Payer: Self-pay | Admitting: Obstetrics & Gynecology

## 2018-06-10 NOTE — Patient Instructions (Signed)
1. Pelvic pain in female Resolved.  2. Cysts of both ovaries Very small bilateral ovarian cysts measuring 1.5 x 1.6 cm on the right with thin anterior wall septations and 1.3 x 1.3 on the left with a thick wall and both cystic and solid content.  Given those findings, will follow up with a repeat pelvic ultrasound in 2-3 months.  Ca125 and CEA drawn today. - CA 125 - CEA - US Transvaginal Non-OB; Future  Amy Peters, it was a pleasure seeing you today!

## 2018-07-03 ENCOUNTER — Ambulatory Visit: Payer: 59 | Admitting: General Surgery

## 2018-07-06 ENCOUNTER — Ambulatory Visit
Admission: RE | Admit: 2018-07-06 | Discharge: 2018-07-06 | Disposition: A | Discharge status: Home / Self Care | Payer: 59 | Source: Ambulatory Visit | Attending: General Surgery | Admitting: General Surgery

## 2018-07-06 DIAGNOSIS — Z1231 Encounter for screening mammogram for malignant neoplasm of breast: Secondary | ICD-10-CM

## 2018-07-10 ENCOUNTER — Encounter: Payer: Self-pay | Admitting: General Surgery

## 2018-07-10 ENCOUNTER — Ambulatory Visit (INDEPENDENT_AMBULATORY_CARE_PROVIDER_SITE_OTHER): Payer: 59 | Admitting: General Surgery

## 2018-07-10 ENCOUNTER — Other Ambulatory Visit: Payer: Self-pay

## 2018-07-10 VITALS — BP 131/88 | HR 68 | Temp 97.5°F | Resp 16 | Ht 66.0 in | Wt 206.2 lb

## 2018-07-10 DIAGNOSIS — Z1231 Encounter for screening mammogram for malignant neoplasm of breast: Secondary | ICD-10-CM | POA: Diagnosis not present

## 2018-07-10 DIAGNOSIS — Z803 Family history of malignant neoplasm of breast: Secondary | ICD-10-CM

## 2018-07-10 NOTE — Progress Notes (Signed)
Patient ID: Amy Peters, female   DOB: May 19, 1971, 47 y.o.   MRN: [id number]  Chief Complaint  Patient presents with  . Follow-up     one year f/u recall bil screening mammo [facility] 06/29/18, [facility] r/s mammo 07/06/18    HPI Amy Peters is a 47 y.o. female here today for one year follow up for  bilateral screening mammo [facility] 07/06/18. Patient states she is feeling well.  HPI  Past Medical History:  Diagnosis Date  . Anxiety   . Breast fibroadenoma    LEFT  . Bronchitis   . Depression   . Hypothyroid 2005  . IUFD (intrauterine fetal death)    @ 42 WEEKS  . Personal history of tobacco use, presenting hazards to health     Past Surgical History:  Procedure Laterality Date  . BREAST EXCISIONAL BIOPSY Left 07/13/2012   neg  . BREAST EXCISIONAL BIOPSY Left 2011   neg  . BREAST SURGERY Left 2009, 2013   excision left breast lesion  . DILATION AND CURETTAGE OF UTERUS  1996  . [place], 2002  . HYSTEROSCOPY  2007   HYST., D&C/POLYP  . INTRAUTERINE DEVICE INSERTION  11/2011   Mirena  . PILONIDAL CYST EXCISION  1994  . RADIOACTIVE IODINE ABLATION  12-05-2003   OF [id number]    Family History  Problem Relation Age of Onset  . Breast cancer Mother 75       01/2010  . Hypertension Father   . Cancer Maternal Grandfather        colon    Social History Social History   Tobacco Use  . Smoking status: Former Smoker    Years: 15.00    Types: Cigarettes    Last attempt to quit: 05/12/2017    Years since quitting: 1.1  . Smokeless tobacco: Never Used  Substance Use Topics  . Alcohol use: Yes    Alcohol/week: 0.0 standard drinks    Comment: Socially  . Drug use: No    No Known Allergies  Current Outpatient Medications  Medication Sig Dispense Refill  . buPROPion (WELLBUTRIN XL) 300 MG 24 hr tablet Take by mouth.    . valACYclovir (VALTREX) 1000 MG tablet valacyclovir 1 gram tablet    . azithromycin  (ZITHROMAX) 250 MG tablet TAKE 2 TABLETS BY MOUTH ON DAY 1 AND THEN TAKE 1 TABLET BY MOUTH ONCE A DAY ON DAY 2 THROUGH DAY 5  0  . Calcium Carb-Cholecalciferol 600-800 MG-UNIT CHEW Chew by mouth.    Marland Kitchen ibuprofen (ADVIL,MOTRIN) 200 MG tablet Take 200 mg by mouth every 6 (six) hours as needed.    Marland Kitchen levonorgestrel (MIRENA) 20 MCG/24HR IUD by Intrauterine route.    Marland Kitchen levothyroxine (SYNTHROID, LEVOTHROID) 125 MCG tablet Take 1 tablet (125 mcg total) by mouth daily before breakfast. 90 tablet 0  . meloxicam (MOBIC) 15 MG tablet Mobic 15 mg tablet  Take 1 tablet(s) EVERY DAY by oral route.     No current facility-administered medications for this visit.     Review of Systems Review of Systems  Constitutional: Negative.   Respiratory: Negative.   Cardiovascular: Negative.     Blood pressure 131/88, pulse 68, temperature (!) 97.5 F (36.4 C), temperature source Temporal, resp. rate 16, height 5\' 6"  (1.676 m), weight 206 lb 3.2 oz (93.5 kg), SpO2 98 %.  Physical Exam Physical Exam  Constitutional: She is oriented to person, place, and time. She appears  well-developed and well-nourished.  Pulmonary/Chest: Right breast exhibits no inverted nipple, no mass, no nipple discharge, no skin change and no tenderness. Left breast exhibits no inverted nipple, no mass, no nipple discharge, no skin change and no tenderness. Breasts are symmetrical.    Lymphadenopathy:    She has no cervical adenopathy.  Neurological: She is alert and oriented to person, place, and time.  Skin: Skin is warm and dry.    Data Reviewed Bilateral screening mammograms dated July 06, 2018 reviewed.  BI-RADS-1.  Baker Janus model risk calculation: 5 years, 3.9%, lifetime 25.9%.  Assessment    Benign breast exam and mammograms.  Family history of breast cancer (single member).    Plan I reviewed the options for chemoprevention with the patient.  She does meet criteria with her 5-year risk at over 1.7%.  Reviewing the pros  and cons at this time, we are both comfortable with not making use of chemoprevention.  She was given information to review on her own and she will contact the office if she would be interested in a trial.   Patient is to return to the office in 1 year with bilateral mammogram. HPI, Physical Exam, Assessment and Plan have been scribed under the direction and in the presence of Hervey Ard, [place] R. Bobette Mo, CMA  I have completed the exam and reviewed the above documentation for accuracy and completeness.  I agree with the above.  Haematologist has been used and any errors in dictation or transcription are unintentional.  Hervey Ard, M.D., F.A.C.S.   Forest Gleason Byrnett 07/11/2018, 5:22 AM

## 2018-07-10 NOTE — Patient Instructions (Addendum)
Patient is to return to the office in 1 year with bilateral mammogram. Call the office with any questions or concerns. Tamoxifen oral solution What is this medicine? TAMOXIFEN (ta MOX i fen) blocks the effects of estrogen. It is commonly used to treat breast cancer. It is also used to decrease the chance of breast cancer coming back in women who have received treatment for the disease. It may also help prevent breast cancer in women who have a high risk of developing breast cancer. This medicine may be used for other purposes; ask your health care provider or pharmacist if you have questions. COMMON BRAND NAME(S): Soltamox What should I tell my health care provider before I take this medicine? They need to know if you have any of these conditions: -blood clots -blood disease -cataracts or impaired eyesight -endometriosis -high calcium levels -high cholesterol -irregular menstrual cycles -liver disease -stroke -uterine fibroids -an unusual reaction to tamoxifen, other medicines, foods, dyes, or preservatives -pregnant or trying to get pregnant -breast-feeding How should I use this medicine? Take this medicine by mouth with a glass of water. Follow the directions on the prescription label. You can take it with or without food. Take your medicine at regular intervals. Do not take your medicine more often than directed. Do not stop taking except on your doctor's advice. A special MedGuide will be given to you by the pharmacist with each prescription and refill. Be sure to read this information carefully each time. Talk to your pediatrician regarding the use of this medicine in children. While this drug may be prescribed for selected conditions, precautions do apply. Overdosage: If you think you have taken too much of this medicine contact a poison control center or emergency room at once. NOTE: This medicine is only for you. Do not share this medicine with others. What if I miss a dose? If  you miss a dose, take it as soon as you can. If it is almost time for your next dose, take only that dose. Do not take double or extra doses. What may interact with this medicine? Do not take this medicine with any of the following medications: -cisapride -certain medicines for irregular heart beat like dofetilide, dronedarone, quinidine -certain medicines for fungal infection like fluconazole, posaconazole -pimozide -saquinavir -thioridazine This medicine may also interact with the following medications: -aminoglutethimide -anastrozole -bromocriptine -chemotherapy drugs -female hormones, like estrogens and birth control pills -letrozole -medroxyprogesterone -phenobarbital -rifampin -warfarin This list may not describe all possible interactions. Give your health care provider a list of all the medicines, herbs, non-prescription drugs, or dietary supplements you use. Also tell them if you smoke, drink alcohol, or use illegal drugs. Some items may interact with your medicine. What should I watch for while using this medicine? Visit your doctor or health care professional for regular checks on your progress. You will need regular pelvic exams, breast exams, and mammograms. If you are taking this medicine to reduce your risk of getting breast cancer, you should know that this medicine does not prevent all types of breast cancer. If breast cancer or other problems occur, there is no guarantee that it will be found at an early stage. Do not become pregnant while taking this medicine or for 2 months after stopping this medicine. Stop taking this medicine if you get pregnant or think you are pregnant and contact your doctor. This medicine may harm your unborn baby. Women who can possibly become pregnant should use birth control methods that do not use hormones  during tamoxifen treatment and for 2 months after therapy has stopped. Talk with your health care provider for birth control advice. Do not  breast feed while taking this medicine. What side effects may I notice from receiving this medicine? Side effects that you should report to your doctor or health care professional as soon as possible: -allergic reactions like skin rash, itching or hives, swelling of the face, lips, or tongue -changes in vision -changes in your menstrual cycle -difficulty walking or talking -new breast lumps -numbness -pelvic pain or pressure -redness, blistering, peeling or loosening of the skin, including inside the mouth -signs and symptoms of a dangerous change in heartbeat or heart rhythm like chest pain, dizziness, fast or irregular heartbeat, palpitations, feeling faint or lightheaded, falls, breathing problems -sudden chest pain -swelling, pain or tenderness in your calf or leg -unusual bruising or bleeding -vaginal discharge that is bloody, brown, or rust -weakness -yellowing of the whites of the eyes or skin Side effects that usually do not require medical attention (report to your doctor or health care professional if they continue or are bothersome): -fatigue -hair loss, although uncommon and is usually mild -headache -hot flashes -impotence (in men) -nausea, vomiting (mild) -vaginal discharge (white or clear) This list may not describe all possible side effects. Call your doctor for medical advice about side effects. You may report side effects to FDA at 1-800-FDA-1088. Where should I keep my medicine? Keep out of the reach of children. Store in the original package at room temperature between 20 and 25 degrees C (68 and 77 degrees F). Do not store above 25 degrees C (77 degrees F). DO NOT freeze or refrigerate. Protect from light. Keep container tightly closed. Use within 3 months of opening. Throw away any unused medicine after the expiration date. NOTE: This sheet is a summary. It may not cover all possible information. If you have questions about this medicine, talk to your doctor,  pharmacist, or health care provider.  2018 Elsevier/Gold Standard (2016-02-26 07:13:59)

## 2018-08-23 ENCOUNTER — Ambulatory Visit: Payer: 59 | Admitting: Obstetrics & Gynecology

## 2018-08-23 ENCOUNTER — Other Ambulatory Visit: Payer: 59

## 2018-08-30 ENCOUNTER — Ambulatory Visit (INDEPENDENT_AMBULATORY_CARE_PROVIDER_SITE_OTHER): Payer: 59

## 2018-08-30 ENCOUNTER — Encounter: Payer: Self-pay | Admitting: Obstetrics & Gynecology

## 2018-08-30 ENCOUNTER — Ambulatory Visit: Payer: 59 | Admitting: Obstetrics & Gynecology

## 2018-08-30 DIAGNOSIS — N83202 Unspecified ovarian cyst, left side: Secondary | ICD-10-CM

## 2018-08-30 DIAGNOSIS — N83201 Unspecified ovarian cyst, right side: Secondary | ICD-10-CM

## 2018-08-30 NOTE — Progress Notes (Signed)
    Amy Peters [birth year] [id number]        48 y.o.  [id number] Married  RP: Bilateral ovarian cysts for f/u Pelvic US  HPI: No pelvic pain.  Well on Mirena IUD x 12/2016.  No BTB.   OB History  Gravida Para Term Preterm AB Living  4 2 1 1 2 2   SAB TAB Ectopic Multiple Live Births  2       2    # Outcome Date GA Lbr Len/2nd Weight Sex Delivery Anes PTL Lv  4 SAB           3 Preterm     M Vag-Spont  Y LIV  2 Term     M Vag-Spont  N LIV  1 SAB             Obstetric Comments  Age first menstrual cycle 29  Age first pregnancy 57  IUD 2008    Past medical history,surgical history, problem list, medications, allergies, family history and social history were all reviewed and documented in the EPIC chart.   Directed ROS with pertinent positives and negatives documented in the history of present illness/assessment and plan.  Exam:  There were no vitals filed for this visit. General appearance:  Normal  Pelvic US today: T/V images.  Uterus anteverted homogeneous measuring 8.68 x 4.66 x 4.13 cm.  Normal endometrial lining at 2.2 mm.  IUD in good intrauterine position.  Left ovary normal, previous cyst resolved compared to scan June 05, 2018.  Right ovary with a 2.4 x 1.8 cm simple follicle and a thin-walled cyst measuring 2.1 x 1.9 cm with internal echoes and thin anterior wall septations, minimal increase in size since June 05, 2018.  No free fluid in the posterior to the sac.  Ca1 25 normal at 12 in October 2019.   Assessment/Plan:  48 y.o. [id number]   1. Right ovarian cyst Pelvic ultrasound findings reviewed with patient.  Resolved left ovarian cyst.  Benign appearing small right ovarian follicle and cyst.  Ca1 25 normal at 12 in October 2019.  No pelvic pain.  Mirena IUD well-tolerated and in good location.  Patient reassured.  Will follow-up for annual exam September 10, 2018.  Counseling on above issues and coordination of care more than 50% for 15  minutes.  Princess Bruins MD, 4:29 PM 08/30/2018

## 2018-08-30 NOTE — Patient Instructions (Signed)
1. Right ovarian cyst Pelvic ultrasound findings reviewed with patient.  Resolved left ovarian cyst.  Benign appearing small right ovarian follicle and cyst.  Ca1 25 normal at 12 in October 2019.  No pelvic pain.  Mirena IUD well-tolerated and in good location.  Patient reassured.  Will follow-up for annual exam September 10, 2018.  Xan, it was a pleasure seeing you today!

## 2018-09-10 ENCOUNTER — Encounter: Payer: Self-pay | Admitting: Obstetrics & Gynecology

## 2018-09-10 ENCOUNTER — Ambulatory Visit: Payer: 59 | Admitting: Obstetrics & Gynecology

## 2018-09-10 ENCOUNTER — Ambulatory Visit (INDEPENDENT_AMBULATORY_CARE_PROVIDER_SITE_OTHER): Payer: 59 | Admitting: Obstetrics & Gynecology

## 2018-09-10 ENCOUNTER — Other Ambulatory Visit: Payer: 59

## 2018-09-10 VITALS — BP 126/84 | Ht 66.0 in | Wt 200.0 lb

## 2018-09-10 DIAGNOSIS — Z01419 Encounter for gynecological examination (general) (routine) without abnormal findings: Secondary | ICD-10-CM

## 2018-09-10 DIAGNOSIS — Z6832 Body mass index (BMI) 32.0-32.9, adult: Secondary | ICD-10-CM | POA: Diagnosis not present

## 2018-09-10 DIAGNOSIS — Z30431 Encounter for routine checking of intrauterine contraceptive device: Secondary | ICD-10-CM

## 2018-09-10 DIAGNOSIS — E6609 Other obesity due to excess calories: Secondary | ICD-10-CM | POA: Diagnosis not present

## 2018-09-10 NOTE — Progress Notes (Signed)
Amy Peters [birth year] [id number]   History:    48 y.o. G4P2A2L2 Married  RP:  Established patient presenting for annual gyn exam   HPI: Well on Mirena IUD since May 2018.  No BTB.  Recent Pelvic US 08/30/2018 for pelvic discomfort was normal with Mirena IUD in good position.  No pain with IC.  Breasts normal.  BMI 32.28.  Low level of physical activity in the winter.  Healthy nutrition.  Fam MD for [facility].  Hypothyroidism on Synthroid.  Past medical history,surgical history, family history and social history were all reviewed and documented in the EPIC chart.  Gynecologic History No LMP recorded. (Menstrual status: IUD). Contraception: IUD since May 2018 Last Pap: 08/2017. Results were: Negative/HPV HR neg Last mammogram: 06/2018. Results were: Negative Bone Density: Never Colonoscopy: Never  Obstetric History OB History  Gravida Para Term Preterm AB Living  4 2 1 1 2 2   SAB TAB Ectopic Multiple Live Births  2       2    # Outcome Date GA Lbr Len/2nd Weight Sex Delivery Anes PTL Lv  4 SAB           3 Preterm     M Vag-Spont  Y LIV  2 Term     M Vag-Spont  N LIV  1 SAB             Obstetric Comments  Age first menstrual cycle 39  Age first pregnancy 23  IUD 2008     ROS: A ROS was performed and pertinent positives and negatives are included in the history.  GENERAL: No fevers or chills. HEENT: No change in vision, no earache, sore throat or sinus congestion. NECK: No pain or stiffness. CARDIOVASCULAR: No chest pain or pressure. No palpitations. PULMONARY: No shortness of breath, cough or wheeze. GASTROINTESTINAL: No abdominal pain, nausea, vomiting or diarrhea, melena or bright red blood per rectum. GENITOURINARY: No urinary frequency, urgency, hesitancy or dysuria. MUSCULOSKELETAL: No joint or muscle pain, no back pain, no recent trauma. DERMATOLOGIC: No rash, no itching, no lesions. ENDOCRINE: No polyuria, polydipsia, no heat or cold intolerance. No recent change  in weight. HEMATOLOGICAL: No anemia or easy bruising or bleeding. NEUROLOGIC: No headache, seizures, numbness, tingling or weakness. PSYCHIATRIC: No depression, no loss of interest in normal activity or change in sleep pattern.     Exam:   BP 126/84   Ht 5\' 6"  (1.676 m)   Wt 200 lb (90.7 kg)   BMI 32.28 kg/m   Body mass index is 32.28 kg/m.  General appearance : Well developed well nourished female. No acute distress HEENT: Eyes: no retinal hemorrhage or exudates,  Neck supple, trachea midline, no carotid bruits, no thyroidmegaly Lungs: Clear to auscultation, no rhonchi or wheezes, or rib retractions  Heart: Regular rate and rhythm, no murmurs or gallops Breast:Examined in sitting and supine position were symmetrical in appearance, no palpable masses or tenderness,  no skin retraction, no nipple inversion, no nipple discharge, no skin discoloration, no axillary or supraclavicular lymphadenopathy Abdomen: no palpable masses or tenderness, no rebound or guarding Extremities: no edema or skin discoloration or tenderness  Pelvic: Vulva: Normal             Vagina: No gross lesions or discharge  Cervix: No gross lesions or discharge.  IUD strings felt.  Uterus  AV, normal size, shape and consistency, non-tender and mobile  Adnexa  Without masses or tenderness  Anus: Normal   Assessment/Plan:  48 y.o. female for annual exam   1. Well female exam with routine gynecological exam Normal gynecologic exam.  Pap test negative with negative high-risk HPV in January 2019.  No indication to repeat a Pap test this year.  Breast exam normal.  Last screening mammogram was negative November 2019.  Health labs with family physician.  2. Encounter for routine checking of intrauterine contraceptive device (IUD) Mirena IUD in good position and well-tolerated.  Position also confirmed by pelvic ultrasound August 30, 2018.  3. Class 1 obesity due to excess calories without serious comorbidity with body  mass index (BMI) of 32.0 to 32.9 in adult Recommend lower calorie/carb diet such as [employer].  Intermittent fasting also discussed.  Recommend aerobic physical activities 5 times a week and weightlifting every 2 days.  Princess Bruins MD, 12:18 PM 09/10/2018

## 2018-09-10 NOTE — Patient Instructions (Signed)
1. Well female exam with routine gynecological exam Normal gynecologic exam.  Pap test negative with negative high-risk HPV in January 2019.  No indication to repeat a Pap test this year.  Breast exam normal.  Last screening mammogram was negative November 2019.  Health labs with family physician.  2. Encounter for routine checking of intrauterine contraceptive device (IUD) Mirena IUD in good position and well-tolerated.  Position also confirmed by pelvic ultrasound August 30, 2018.  3. Class 1 obesity due to excess calories without serious comorbidity with body mass index (BMI) of 32.0 to 32.9 in adult Recommend lower calorie/carb diet such as [employer].  Intermittent fasting also discussed.  Recommend aerobic physical activities 5 times a week and weightlifting every 2 days.  Tuesday, it was a pleasure seeing you today!

## 2018-11-17 ENCOUNTER — Telehealth: Payer: Self-pay | Admitting: Obstetrics and Gynecology

## 2018-11-17 ENCOUNTER — Ambulatory Visit (INDEPENDENT_AMBULATORY_CARE_PROVIDER_SITE_OTHER): Payer: 59 | Admitting: Obstetrics and Gynecology

## 2018-11-17 DIAGNOSIS — R102 Pelvic and perineal pain: Secondary | ICD-10-CM

## 2018-11-17 DIAGNOSIS — Z975 Presence of (intrauterine) contraceptive device: Secondary | ICD-10-CM | POA: Diagnosis not present

## 2018-11-17 MED ORDER — CIPROFLOXACIN HCL 500 MG PO TABS: 500.0000 mg | ORAL_TABLET | Freq: Two times a day (BID) | ORAL | 0 refills | Status: DC

## 2018-11-17 NOTE — Progress Notes (Signed)
GYNECOLOGY  VISIT   HPI: 48 y.o.   Married  Caucasian  female   [phone] with No LMP recorded. (Menstrual status: IUD).   With complaints of possible UTI.  Patient requesting phone consultation.  I called her from home as it is weekend after hours.  Patient of Dr. Dellis Filbert who agrees to after hours telephone consultation, started 9:33 and ended 9:44.   She declines visit to urgent care.  She understands fees for this telephone visit will apply.   Patient reporting cramping lower abdominal pain above her pubic bone that comes and goes.  Had similar episode in the past in October 2019, and she was treated with Ciprofloxacin for presumed UTI, and her symptoms resolved.  Her UC 06/01/18 did return negative.  She had a pelvic US 06/24/18 in follow up to this, and her IUD was confirmed to be in proper position, and she had small bilateral ovarian cysts which in follow up imaging 08/30/18 showed resolution of the left ovarian cyst and minimal increase in size of the right ovarian cyst, measuring 2.1 x 1.9 cm.   She denies dysuria, hematuria, fever.  Denies hx of renal stones.  She reports some back pain and nausea.  Denies vomiting and diarrhea.   She has a Mirena IUD and denies unusual discharge.  Denies concern for STDs.   GYNECOLOGIC HISTORY: No LMP recorded. (Menstrual status: IUD). Contraception:  Mirena IUD. Menopausal hormone therapy: NA Last mammogram:  07/06/18 - BI-RADS1 Last pap smear:   09/01/17 - Normal, neg HR HPV        OB History    Gravida  4   Para  2   Term  1   Preterm  1   AB  2   Living  2     SAB  2   TAB      Ectopic      Multiple      Live Births  2        Obstetric Comments  Age first menstrual cycle 72 Age first pregnancy 21 IUD 2008           Patient Active Problem List   Diagnosis Date Noted  . IUD (intrauterine device) in place 12/22/2016  . Family history of breast cancer 04/12/2013  . Hypothyroidism 01/06/2012    Past Medical  History:  Diagnosis Date  . Anxiety   . Breast fibroadenoma    LEFT  . Bronchitis   . Depression   . Hypothyroid 2005  . IUFD (intrauterine fetal death)    @ 72 WEEKS  . Personal history of tobacco use, presenting hazards to health     Past Surgical History:  Procedure Laterality Date  . BREAST EXCISIONAL BIOPSY Left 07/13/2012   neg  . BREAST EXCISIONAL BIOPSY Left 2011   neg  . BREAST SURGERY Left 2009, 2013   excision left breast lesion  . DILATION AND CURETTAGE OF UTERUS  1996  . [place], 2002  . HYSTEROSCOPY  2007   HYST., D&C/POLYP  . INTRAUTERINE DEVICE INSERTION  11/2011   Mirena  . PILONIDAL CYST EXCISION  1994  . RADIOACTIVE IODINE ABLATION  12-05-2003   OF VEHMCNO/70 MILLICURIES OF [id number]. BALAN    Current Outpatient Medications  Medication Sig Dispense Refill  . buPROPion (WELLBUTRIN XL) 300 MG 24 hr tablet Take by mouth.    . ciprofloxacin (CIPRO) 500 MG tablet Take 1 tablet (500 mg total) by mouth 2 (two) times  daily. 14 tablet 0  . levonorgestrel (MIRENA) 20 MCG/24HR IUD by Intrauterine route.    Marland Kitchen levothyroxine (SYNTHROID, LEVOTHROID) 125 MCG tablet Take 1 tablet (125 mcg total) by mouth daily before breakfast. 90 tablet 0  . valACYclovir (VALTREX) 1000 MG tablet as needed.      No current facility-administered medications for this visit.      ALLERGIES: Patient has no known allergies.  Family History  Problem Relation Age of Onset  . Breast cancer Mother 56       01/2010  . Hypertension Father   . Cancer Maternal Grandfather        colon    Social History   Socioeconomic History  . Marital status: Married    Spouse name: Not on file  . Number of children: Not on file  . Years of education: Not on file  . Highest education level: Not on file  Occupational History  . Not on file  Social Needs  . Financial resource strain: Not on file  . Food insecurity:    Worry: Not on file    Inability: Not on file  .  Transportation needs:    Medical: Not on file    Non-medical: Not on file  Tobacco Use  . Smoking status: Former Smoker    Years: 15.00    Types: Cigarettes    Last attempt to quit: 05/12/2017    Years since quitting: 1.5  . Smokeless tobacco: Never Used  Substance and Sexual Activity  . Alcohol use: Yes    Alcohol/week: 0.0 standard drinks    Comment: Socially  . Drug use: No  . Sexual activity: Yes    Partners: Male    Birth control/protection: I.U.D.    Comment: Dec 22 2016  Lifestyle  . Physical activity:    Days per week: Not on file    Minutes per session: Not on file  . Stress: Not on file  Relationships  . Social connections:    Talks on phone: Not on file    Gets together: Not on file    Attends religious service: Not on file    Active member of club or organization: Not on file    Attends meetings of clubs or organizations: Not on file    Relationship status: Not on file  . Intimate partner violence:    Fear of current or ex partner: Not on file    Emotionally abused: Not on file    Physically abused: Not on file    Forced sexual activity: Not on file  Other Topics Concern  . Not on file  Social History Narrative  . Not on file    Review of Systems  See HPI.   PHYSICAL EXAMINATION:    There were no vitals taken for this visit.      ASSESSMENT  Pelvic pain.  Mirena IUD patient.  Possible UTI?  PLAN  We discussed that her symptoms are not typical for UTI.  I do agree to treating her with Ciprofloxacin 500 mg po bid x 7 days in an attempt to keep her out of an urgent care or hospital setting at this time of Coronavirus concerns.  She is aware of potential tendinitis side effects of Ciprofloxacin.  She will follow up with Dr. Dellis Filbert in 2 days and go the the hospital if she worsens during the weekend.  Questions invited and answered.  ___11____ minute phone conversation.

## 2018-11-18 ENCOUNTER — Encounter: Payer: Self-pay | Admitting: Obstetrics and Gynecology

## 2018-11-27 NOTE — Telephone Encounter (Signed)
See after hours phone note.

## 2019-02-15 LAB — HM HEPATITIS C SCREENING LAB: HM Hepatitis Screen: NEGATIVE

## 2019-03-13 ENCOUNTER — Encounter: Payer: Self-pay | Admitting: General Surgery

## 2019-05-07 ENCOUNTER — Other Ambulatory Visit: Payer: Self-pay

## 2019-05-07 DIAGNOSIS — Z1231 Encounter for screening mammogram for malignant neoplasm of breast: Secondary | ICD-10-CM

## 2019-07-12 ENCOUNTER — Ambulatory Visit
Admission: RE | Admit: 2019-07-12 | Discharge: 2019-07-12 | Disposition: A | Discharge status: Home / Self Care | Payer: BC Managed Care – PPO | Source: Ambulatory Visit | Attending: Surgery | Admitting: Surgery

## 2019-07-12 DIAGNOSIS — Z1231 Encounter for screening mammogram for malignant neoplasm of breast: Secondary | ICD-10-CM | POA: Insufficient documentation

## 2019-07-16 ENCOUNTER — Ambulatory Visit (INDEPENDENT_AMBULATORY_CARE_PROVIDER_SITE_OTHER): Payer: BC Managed Care – PPO | Admitting: Surgery

## 2019-07-16 ENCOUNTER — Other Ambulatory Visit: Payer: Self-pay

## 2019-07-16 ENCOUNTER — Encounter: Payer: Self-pay | Admitting: Surgery

## 2019-07-16 VITALS — BP 124/86 | HR 62 | Temp 97.6°F | Resp 14 | Ht 66.0 in | Wt 214.6 lb

## 2019-07-16 DIAGNOSIS — D242 Benign neoplasm of left breast: Secondary | ICD-10-CM | POA: Diagnosis not present

## 2019-07-16 NOTE — Patient Instructions (Signed)
   Follow-up with our office as needed.  Please call and ask to speak with a nurse if you develop questions or concerns.  

## 2019-07-16 NOTE — Progress Notes (Signed)
07/16/2019  History of Present Illness: Amy Peters is a 48 y.o. female with a history of 2 excisional left breast biopsies by Dr. Jamal Collin in 2009 and 2013.  In 2013 the pathology report is available and showed a fibroadenoma.  Otherwise she has not had any issues on the right breast.  She has had mammograms since and these have all been negative.  She had her most recent mammogram on 07/12/2019.  Patient reports that she has been doing well and denies any new findings or concerns.  Particularly denies any masses, skin changes, or nipple changes.  Past Medical History: Past Medical History:  Diagnosis Date  . Anxiety   . Breast fibroadenoma    LEFT  . Bronchitis   . Depression   . Hypothyroid 2005  . IUFD (intrauterine fetal death)    @ 81 WEEKS  . Personal history of tobacco use, presenting hazards to health      Past Surgical History: Past Surgical History:  Procedure Laterality Date  . BREAST EXCISIONAL BIOPSY Left 07/13/2012   neg  . BREAST EXCISIONAL BIOPSY Left 2011   neg  . BREAST SURGERY Left 2009, 2013   excision left breast lesion  . DILATION AND CURETTAGE OF UTERUS  1996  . [place], 2002  . HYSTEROSCOPY  2007   HYST., D&C/POLYP  . INTRAUTERINE DEVICE INSERTION  11/2011   Mirena  . PILONIDAL CYST EXCISION  1994  . RADIOACTIVE IODINE ABLATION  12-05-2003   OF [id number] MILLICURIES OF [id number]. BALAN    Home Medications: Prior to Admission medications   Medication Sig Start Date End Date Taking? Authorizing Provider  cetirizine (ZYRTEC) 10 MG tablet Take by mouth. 02/15/19 02/15/20 Yes [provider]  levonorgestrel (MIRENA) 20 MCG/24HR IUD by Intrauterine route.   Yes [provider]  levothyroxine (SYNTHROID, LEVOTHROID) 125 MCG tablet Take 1 tablet (125 mcg total) by mouth daily before breakfast. 09/11/17  Yes Princess Bruins, MD  valACYclovir (VALTREX) 1000 MG tablet as needed.  09/08/17  Yes [provider]  buPROPion (WELLBUTRIN XL) 300 MG 24 hr tablet Take by mouth. 11/10/17 11/10/18  [provider]    Allergies: No Known Allergies  Review of Systems: Review of Systems  Constitutional: Negative for chills and fever.  Respiratory: Negative for shortness of breath.   Cardiovascular: Negative for chest pain.  Gastrointestinal: Negative for nausea and vomiting.    Physical Exam BP 124/86   Pulse 62   Temp 97.6 F (36.4 C) (Temporal)   Resp 14   Ht 5\' 6"  (1.676 m)   Wt 214 lb 9.6 oz (97.3 kg)   SpO2 98%   BMI 34.64 kg/m  CONSTITUTIONAL: No acute distress HEENT:  Normocephalic, atraumatic, extraocular motion intact. RESPIRATORY:  Lungs are clear, and breath sounds are equal bilaterally. Normal respiratory effort without pathologic use of accessory muscles. CARDIOVASCULAR: Heart is regular without murmurs, gallops, or rubs. BREAST: Patient deferred exam today. NEUROLOGIC:  Motor and sensation is grossly normal.  Cranial nerves are grossly intact. PSYCH:  Alert and oriented to person, place and time. Affect is normal.  Labs/Imaging: Mammogram on 07/12/2019: FINDINGS: There are no findings suspicious for malignancy. Images were processed with CAD.  IMPRESSION: No mammographic evidence of malignancy. A result letter of this screening mammogram will be mailed directly to the patient.  RECOMMENDATION: Screening mammogram in one year. (Code:SM-B-01Y)  BI-RADS CATEGORY  1: Negative.   Assessment and Plan: This is a 48 y.o.  female status post left breast excisional biopsies for benign findings in the past.  -Discussed with the patient that it has been 7 years since her last excisional biopsy and all her mammograms have otherwise been normal.  At this point there is no particular need for surgical follow-up and we will defer further mammograms and exams to her PCP.  Given this, the patient deferred or declined a breast exam today given that her findings  have been benign in the past and mammogram done last week was negative as well.  Discussed with the patient that if for some reason or any findings either on mammograms or if she has any concerns will be happy to see her and be of any help as needed.  Patient may follow-up as needed.  Face-to-face time spent with the patient and care providers was 15 minutes, with more than 50% of the time spent counseling, educating, and coordinating care of the patient.     Melvyn Neth, [facility]

## 2019-09-06 ENCOUNTER — Other Ambulatory Visit: Payer: Self-pay

## 2019-09-09 ENCOUNTER — Encounter: Payer: Self-pay | Admitting: Obstetrics & Gynecology

## 2019-09-09 ENCOUNTER — Ambulatory Visit (INDEPENDENT_AMBULATORY_CARE_PROVIDER_SITE_OTHER): Payer: BC Managed Care – PPO | Admitting: Obstetrics & Gynecology

## 2019-09-09 ENCOUNTER — Other Ambulatory Visit: Payer: Self-pay

## 2019-09-09 VITALS — BP 134/88 | Ht 66.0 in | Wt 212.0 lb

## 2019-09-09 DIAGNOSIS — Z30431 Encounter for routine checking of intrauterine contraceptive device: Secondary | ICD-10-CM

## 2019-09-09 DIAGNOSIS — E6609 Other obesity due to excess calories: Secondary | ICD-10-CM | POA: Diagnosis not present

## 2019-09-09 DIAGNOSIS — Z6834 Body mass index (BMI) 34.0-34.9, adult: Secondary | ICD-10-CM | POA: Diagnosis not present

## 2019-09-09 DIAGNOSIS — Z01419 Encounter for gynecological examination (general) (routine) without abnormal findings: Secondary | ICD-10-CM | POA: Diagnosis not present

## 2019-09-09 NOTE — Patient Instructions (Signed)
1. Well female exam with routine gynecological exam Normal gynecologic exam.  Pap Negative/HPV HR Negative 08/2017, Pap reflex done today.  Breast exam normal.  Screening Mammo 06/2019 Negative.  Fasting Health Labs with Fam MD.  2. Encounter for routine checking of intrauterine contraceptive device (IUD) Mirena IUD x 12/2016, well tolerated and in good position.  3. Class 1 obesity due to excess calories without serious comorbidity with body mass index (BMI) of 34.0 to 34.9 in adult Continue on a low calorie/carb diet such as [employer].  Aerobic physical activities 5 times a week and light weightlifting every 2 days recommended.  Other orders - valACYclovir (VALTREX) 500 MG tablet; Take 500 mg by mouth 2 (two) times daily.  Amy Peters, it was a pleasure seeing you today!  I will inform you of your results as soon as they are available.

## 2019-09-09 NOTE — Progress Notes (Signed)
Amy Peters [birth year] [id number]   History:    49 y.o. G4P2A2L2 Married  RP:  Established patient presenting for annual gyn exam   HPI: Well on Mirena IUD since May 2018.  No BTB.  Recent Pelvic US 08/30/2018 for pelvic discomfort was normal with Mirena IUD in good position.  No pain with IC.  Breasts normal.  BMI was 32.28 last year, now increased to 34.22.  Low level of physical activity in the winter.  Healthy nutrition.  Fam MD for Health Labs.  Hypothyroidism on Synthroid.  Past medical history,surgical history, family history and social history were all reviewed and documented in the EPIC chart.  Gynecologic History No LMP recorded. (Menstrual status: IUD).  Obstetric History OB History  Gravida Para Term Preterm AB Living  4 2 1 1 2 2   SAB TAB Ectopic Multiple Live Births  2       2    # Outcome Date GA Lbr Len/2nd Weight Sex Delivery Anes PTL Lv  4 SAB           3 Preterm     M Vag-Spont  Y LIV  2 Term     M Vag-Spont  N LIV  1 SAB             Obstetric Comments  Age first menstrual cycle 21  Age first pregnancy 33  IUD 2008     ROS: A ROS was performed and pertinent positives and negatives are included in the history.  GENERAL: No fevers or chills. HEENT: No change in vision, no earache, sore throat or sinus congestion. NECK: No pain or stiffness. CARDIOVASCULAR: No chest pain or pressure. No palpitations. PULMONARY: No shortness of breath, cough or wheeze. GASTROINTESTINAL: No abdominal pain, nausea, vomiting or diarrhea, melena or bright red blood per rectum. GENITOURINARY: No urinary frequency, urgency, hesitancy or dysuria. MUSCULOSKELETAL: No joint or muscle pain, no back pain, no recent trauma. DERMATOLOGIC: No rash, no itching, no lesions. ENDOCRINE: No polyuria, polydipsia, no heat or cold intolerance. No recent change in weight. HEMATOLOGICAL: No anemia or easy bruising or bleeding. NEUROLOGIC: No headache, seizures, numbness, tingling or weakness.  PSYCHIATRIC: No depression, no loss of interest in normal activity or change in sleep pattern.     Exam:   BP 134/88   Ht 5\' 6"  (1.676 m)   Wt 212 lb (96.2 kg)   BMI 34.22 kg/m   Body mass index is 34.22 kg/m.  General appearance : Well developed well nourished female. No acute distress HEENT: Eyes: no retinal hemorrhage or exudates,  Neck supple, trachea midline, no carotid bruits, no thyroidmegaly Lungs: Clear to auscultation, no rhonchi or wheezes, or rib retractions  Heart: Regular rate and rhythm, no murmurs or gallops Breast:Examined in sitting and supine position were symmetrical in appearance, no palpable masses or tenderness,  no skin retraction, no nipple inversion, no nipple discharge, no skin discoloration, no axillary or supraclavicular lymphadenopathy Abdomen: no palpable masses or tenderness, no rebound or guarding Extremities: no edema or skin discoloration or tenderness  Pelvic: Vulva: Normal             Vagina: No gross lesions or discharge  Cervix: No gross lesions or discharge.  IUD stings visible.  Pap reflex done.  Uterus  AV, normal size, shape and consistency, non-tender and mobile  Adnexa  Without masses or tenderness  Anus: Normal   Assessment/Plan:  49 y.o. female for annual exam   1. Well female exam with  routine gynecological exam Normal gynecologic exam.  Pap Negative/HPV HR Negative 08/2017, Pap reflex done today.  Breast exam normal.  Screening Mammo 06/2019 Negative.  Fasting Health Labs with Fam MD.  2. Encounter for routine checking of intrauterine contraceptive device (IUD) Mirena IUD x 12/2016, well tolerated and in good position.  3. Class 1 obesity due to excess calories without serious comorbidity with body mass index (BMI) of 34.0 to 34.9 in adult Continue on a low calorie/carb diet such as [employer].  Aerobic physical activities 5 times a week and light weightlifting every 2 days recommended.  Other orders - valACYclovir  (VALTREX) 500 MG tablet; Take 500 mg by mouth 2 (two) times daily.  Princess Bruins MD, 12:12 PM 09/09/2019

## 2019-09-09 NOTE — Addendum Note (Signed)
Addended by: Thurnell Garbe A on: 09/09/2019 12:43 PM   Modules accepted: Orders

## 2019-09-11 LAB — PAP IG W/ RFLX HPV ASCU

## 2020-01-08 ENCOUNTER — Other Ambulatory Visit: Payer: Self-pay

## 2020-01-08 ENCOUNTER — Ambulatory Visit: Payer: BC Managed Care – PPO | Admitting: Dermatology

## 2020-01-08 DIAGNOSIS — L578 Other skin changes due to chronic exposure to nonionizing radiation: Secondary | ICD-10-CM | POA: Diagnosis not present

## 2020-01-08 DIAGNOSIS — D229 Melanocytic nevi, unspecified: Secondary | ICD-10-CM

## 2020-01-08 DIAGNOSIS — L814 Other melanin hyperpigmentation: Secondary | ICD-10-CM | POA: Diagnosis not present

## 2020-01-08 DIAGNOSIS — D489 Neoplasm of uncertain behavior, unspecified: Secondary | ICD-10-CM

## 2020-01-08 DIAGNOSIS — D039 Melanoma in situ, unspecified: Secondary | ICD-10-CM

## 2020-01-08 DIAGNOSIS — D485 Neoplasm of uncertain behavior of skin: Secondary | ICD-10-CM

## 2020-01-08 HISTORY — DX: Melanoma in situ, unspecified: D03.9

## 2020-01-08 NOTE — Progress Notes (Signed)
   New Patient Visit  Subjective  Amy Peters is a 49 y.o. female who presents for the following: New Patient (Initial Visit) (has a dark brown irregular spot on left upper arm for about 2 years. Patient not sure if there has been any change and would like to have it looked at.).  She tends to notice it more now.  The following portions of the chart were reviewed this encounter and updated as appropriate:      Review of Systems:  No other skin or systemic complaints except as noted in HPI or Assessment and Plan.  Objective  Well appearing patient in no apparent distress; mood and affect are within normal limits.  A focused examination was performed including Left Upper Arm. Relevant physical exam findings are noted in the Assessment and Plan.  Objective  Left Upper Arm above antecubitum: 1cm brown macule with irregular border and pigment        Assessment & Plan  Neoplasm of uncertain behavior Left Upper Arm above antecubitum  Skin / nail biopsy Type of biopsy: tangential   Informed consent: discussed and consent obtained   Timeout: patient name, date of birth, surgical site, and procedure verified   Patient was prepped and draped in usual sterile fashion: area was prepped with Alocohol. Anesthesia: the lesion was anesthetized in a standard fashion   Anesthetic:  1% lidocaine w/ epinephrine 1-100,000 buffered w/ 8.4% NaHCO3 Instrument used: flexible razor blade   Hemostasis achieved with: pressure, aluminum chloride and electrodesiccation   Outcome: patient tolerated procedure well   Post-procedure details: sterile dressing applied and wound care instructions given   Post-procedure details comment:  Ointment and small bandage applied Dressing type: bandage (mupirocin)    Specimen 1 - Surgical pathology Differential Diagnosis: Dysplastic nevus R/O Melanoma Check Margins: No 1cm brown macule with irregular border and pigment  Shave biopsy today    Lentigines -  Scattered tan macules - Discussed due to sun exposure - Benign, observe - Call for any changes  Melanocytic Nevi - Tan-brown and/or pink-flesh-colored symmetric macules and papules - Benign appearing on exam today - Observation - Call clinic for new or changing moles - Recommend daily use of broad spectrum spf 30+ sunscreen to sun-exposed areas.   Actinic Damage - diffuse scaly erythematous macules with underlying dyspigmentation - Recommend daily broad spectrum sunscreen SPF 30+ to sun-exposed areas, reapply every 2 hours as needed.  - Call for new or changing lesions.   Return will schedule f/up pending biopsy results.  Marene Lenz, CMA, am acting as scribe for Brendolyn Patty, MD .  Documentation: I have reviewed the above documentation for accuracy and completeness, and I agree with the above.  Brendolyn Patty MD

## 2020-01-08 NOTE — Patient Instructions (Addendum)
Wound Care Instructions  1. Cleanse wound gently with soap and water once a day then pat dry with clean gauze. Apply a thing coat of Petrolatum (petroleum jelly, "Vaseline") over the wound (unless you have an allergy to this). We recommend that you use a new, sterile tube of Vaseline. Do not pick or remove scabs. Do not remove the yellow or white "healing tissue" from the base of the wound.  2. Cover the wound with fresh, clean, nonstick gauze and secure with paper tape. You may use Band-Aids in place of gauze and tape if the would is small enough, but would recommend trimming much of the tape off as there is often too much. Sometimes Band-Aids can irritate the skin.  3. You should call the office for your biopsy report after 1 week if you have not already been contacted.  4. If you experience any problems, such as abnormal amounts of bleeding, swelling, significant bruising, significant pain, or evidence of infection, please call the office immediately.  5. FOR ADULT SURGERY PATIENTS: If you need something for pain relief you may take 1 extra strength Tylenol (acetaminophen) AND 2 Ibuprofen (200mg each) together every 4 hours as needed for pain. (do not take these if you are allergic to them or if you have a reason you should not take them.) Typically, you may only need pain medication for 1 to 3 days.   Melanoma ABCDEs  Melanoma is the most dangerous type of skin cancer, and is the leading cause of death from skin disease.  You are more likely to develop melanoma if you:  Have light-colored skin, light-colored eyes, or red or blond hair  Spend a lot of time in the sun  Tan regularly, either outdoors or in a tanning bed  Have had blistering sunburns, especially during childhood  Have a close family member who has had a melanoma  Have atypical moles or large birthmarks  Early detection of melanoma is key since treatment is typically straightforward and cure rates are extremely high if we  catch it early.   The first sign of melanoma is often a change in a mole or a new dark spot.  The ABCDE system is a way of remembering the signs of melanoma.  A for asymmetry:  The two halves do not match. B for border:  The edges of the growth are irregular. C for color:  A mixture of colors are present instead of an even brown color. D for diameter:  Melanomas are usually (but not always) greater than 6mm - the size of a pencil eraser. E for evolution:  The spot keeps changing in size, shape, and color.  Please check your skin once per month between visits. You can use a small mirror in front and a large mirror behind you to keep an eye on the back side or your body.   If you see any new or changing lesions before your next follow-up, please call to schedule a visit.  Please continue daily skin protection including broad spectrum sunscreen SPF 30+ to sun-exposed areas, reapplying every 2 hours as needed when you're outdoors.     

## 2020-01-10 ENCOUNTER — Ambulatory Visit: Payer: BC Managed Care – PPO | Admitting: Dermatology

## 2020-02-19 ENCOUNTER — Ambulatory Visit: Payer: BC Managed Care – PPO | Admitting: Dermatology

## 2020-02-19 ENCOUNTER — Other Ambulatory Visit: Payer: Self-pay

## 2020-02-19 DIAGNOSIS — D0362 Melanoma in situ of left upper limb, including shoulder: Secondary | ICD-10-CM

## 2020-02-19 MED ORDER — MUPIROCIN 2 % EX OINT: 1.0000 "application " | TOPICAL_OINTMENT | Freq: Every day | CUTANEOUS | 1 refills | Status: DC

## 2020-02-19 NOTE — Progress Notes (Signed)
   Follow-Up Visit   Subjective  Amy Peters is a 49 y.o. female who presents for the following: Skin Cancer.  Patient here today for excision of biopsy proven MMis at left upper arm above antecubital.   The following portions of the chart were reviewed this encounter and updated as appropriate:      Review of Systems:  No other skin or systemic complaints except as noted in HPI or Assessment and Plan.  Objective  Well appearing patient in no apparent distress; mood and affect are within normal limits.  A focused examination was performed including left arm. Relevant physical exam findings are noted in the Assessment and Plan.  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  Skin excision  Lesion length (cm):  1.2 Lesion width (cm):  0.8 Margin per side (cm):  0.5 Total excision diameter (cm):  2.2 Informed consent: discussed and consent obtained   Timeout: patient name, date of birth, surgical site, and procedure verified   Procedure prep:  Patient was prepped and draped in usual sterile fashion Prep type:  Povidone-iodine and isopropyl alcohol Anesthesia: the lesion was anesthetized in a standard fashion   Anesthetic:  1% lidocaine w/ epinephrine 1-100,000 buffered w/ 8.4% NaHCO3 (16cc) Instrument used: #15 blade   Hemostasis achieved with: pressure   Outcome: patient tolerated procedure well with no complications    Skin repair Complexity:  Complex Final length (cm):  5 Informed consent: discussed and consent obtained   Reason for type of repair: reduce tension to allow closure, reduce the risk of dehiscence, infection, and necrosis, reduce subcutaneous dead space and avoid a hematoma, allow closure of the large defect, allow side-to-side closure without requiring a flap or graft and enhance both functionality and cosmetic results     Undermining: area extensively undermined   Undermining comment:  2.2 cm superior edge Subcutaneous layers (deep stitches):  Suture size:  3-0 Suture type: Vicryl (polyglactin 910)   Stitches:  Buried vertical mattress Fine/surface layer approximation (top stitches):  Suture size:  4-0 Suture type: nylon   Stitches: simple interrupted   Suture removal (days):  7 Hemostasis achieved with: pressure Outcome: patient tolerated procedure well with no complications   Post-procedure details: sterile dressing applied and wound care instructions given   Dressing type: pressure dressing (mupirocin ointment)   Additional details:  Continue Mupirocin ointment daily to excision site and cover with bandage.  mupirocin ointment (BACTROBAN) 2 %  Specimen 1 - Surgical pathology Differential Diagnosis:Melanoma in situ   Check Margins: yes Tagged at 9 o'clock medial  Return in about 1 week (around 02/26/2020) for Suture Removal.  Graciella Belton, RMA, am acting as scribe for Brendolyn Patty, MD .  Documentation: I have reviewed the above documentation for accuracy and completeness, and I agree with the above.  Brendolyn Patty MD

## 2020-02-19 NOTE — Patient Instructions (Signed)

## 2020-02-20 ENCOUNTER — Telehealth: Payer: Self-pay

## 2020-02-20 NOTE — Telephone Encounter (Signed)
Called patient and she is doing fine following yesterday's procedure, JS

## 2020-02-26 ENCOUNTER — Ambulatory Visit (INDEPENDENT_AMBULATORY_CARE_PROVIDER_SITE_OTHER): Payer: BC Managed Care – PPO | Admitting: Dermatology

## 2020-02-26 ENCOUNTER — Other Ambulatory Visit: Payer: Self-pay

## 2020-02-26 DIAGNOSIS — Z4802 Encounter for removal of sutures: Secondary | ICD-10-CM

## 2020-02-26 DIAGNOSIS — D0362 Melanoma in situ of left upper limb, including shoulder: Secondary | ICD-10-CM

## 2020-02-26 NOTE — Progress Notes (Signed)
   Follow-Up Visit   Subjective  Amy Peters is a 49 y.o. female who presents for the following: post op/suture removal (L upper arm above antecubital MMIS healing excision site).  The following portions of the chart were reviewed this encounter and updated as appropriate:     Review of Systems:  No other skin or systemic complaints except as noted in HPI or Assessment and Plan.  Objective  Well appearing patient in no apparent distress; mood and affect are within normal limits.  A focused examination was performed including the left arm. Relevant physical exam findings are noted in the Assessment and Plan.  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  - Patient advised to keep steri-strips dry until they fall off. - Scars remodel for a full year. - Once steri-strips fall off, patient can apply over-the-counter silicone scar cream each night to help with scar remodeling if desired. - Patient advised to call with any concerns or if they notice any new or changing lesions. - Will contact patient with pathology results when available. Results came in after pt visit, and were margins free  Return in about 6 months (around 08/28/2020) for TBSE.  Luther Redo, CMA, am acting as scribe for Brendolyn Patty, MD .  Documentation: I have reviewed the above documentation for accuracy and completeness, and I agree with the above.  Brendolyn Patty MD

## 2020-02-26 NOTE — Patient Instructions (Signed)

## 2020-02-27 ENCOUNTER — Telehealth: Payer: Self-pay

## 2020-02-27 NOTE — Telephone Encounter (Signed)
Patient called and informed of biopsy results, patient verbalized understanding.  

## 2020-04-20 ENCOUNTER — Other Ambulatory Visit: Payer: Self-pay | Admitting: Obstetrics & Gynecology

## 2020-04-20 DIAGNOSIS — Z1231 Encounter for screening mammogram for malignant neoplasm of breast: Secondary | ICD-10-CM

## 2020-07-14 ENCOUNTER — Other Ambulatory Visit: Payer: Self-pay

## 2020-07-14 ENCOUNTER — Ambulatory Visit
Admission: RE | Admit: 2020-07-14 | Discharge: 2020-07-14 | Disposition: A | Discharge status: Home / Self Care | Payer: BC Managed Care – PPO | Source: Ambulatory Visit | Attending: Obstetrics & Gynecology | Admitting: Obstetrics & Gynecology

## 2020-07-14 DIAGNOSIS — Z1231 Encounter for screening mammogram for malignant neoplasm of breast: Secondary | ICD-10-CM

## 2020-09-01 ENCOUNTER — Ambulatory Visit: Payer: BC Managed Care – PPO | Admitting: Dermatology

## 2020-11-22 IMAGING — MG DIGITAL SCREENING BILAT W/ TOMO W/ CAD
8 series · 8 of 24 positions shown · non-contrast
Comparison: Previous exam(s).

CLINICAL DATA: Screening.

EXAM:
DIGITAL SCREENING BILATERAL MAMMOGRAM WITH TOMO AND CAD

[R CC synth-2D]
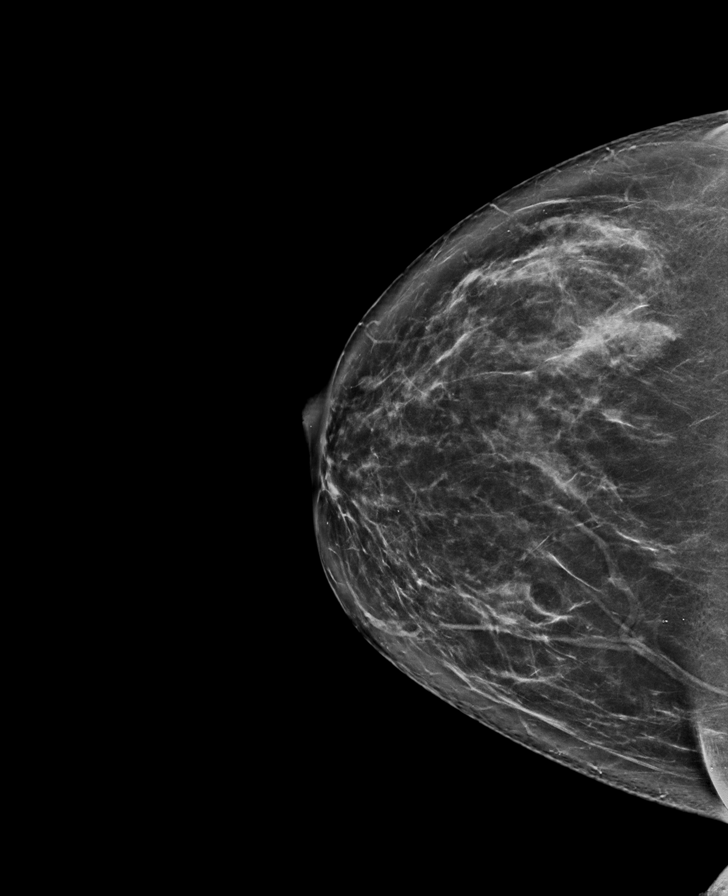

[L CC synth-2D]
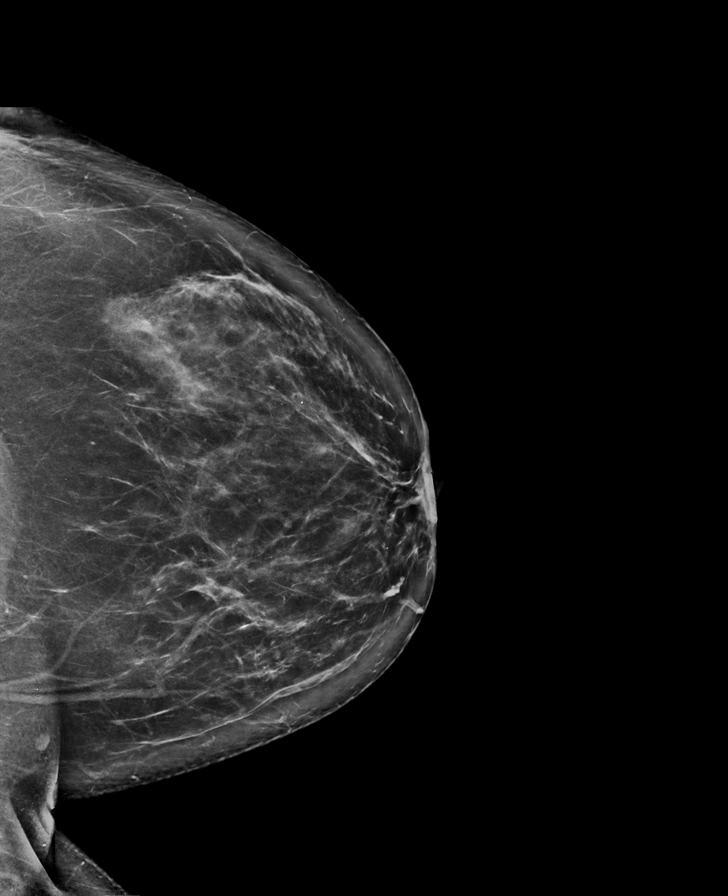

[R MLO synth-2D]
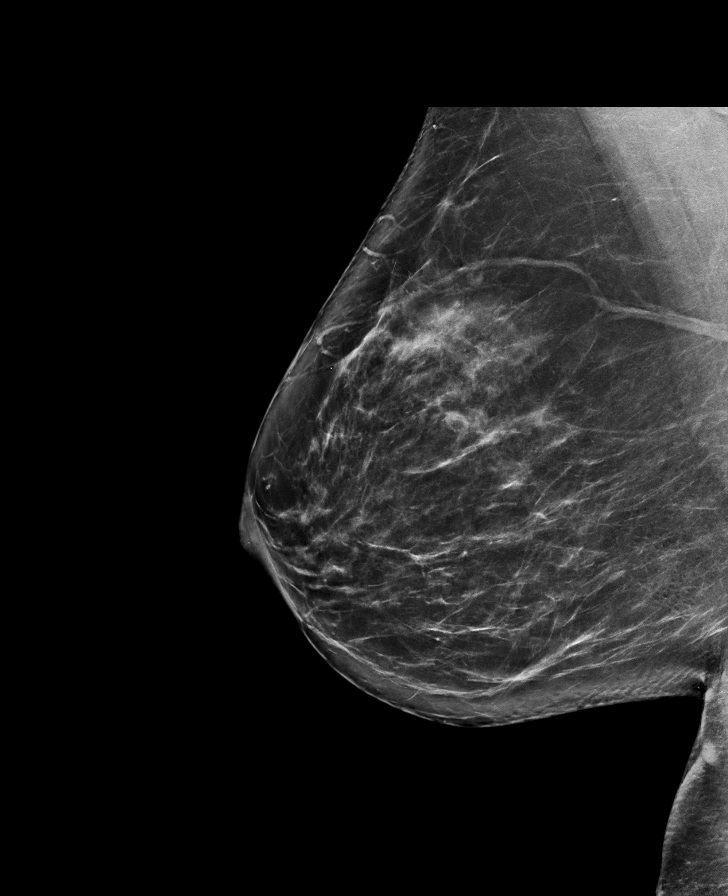

[L MLO synth-2D]
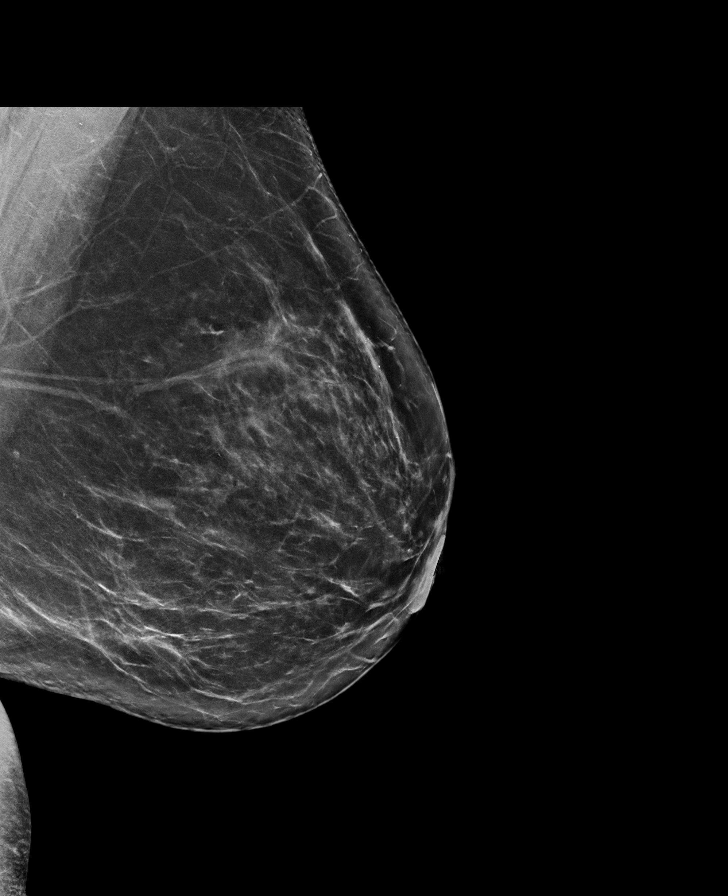

[R CC tomo · tomo slice 43/85.0]
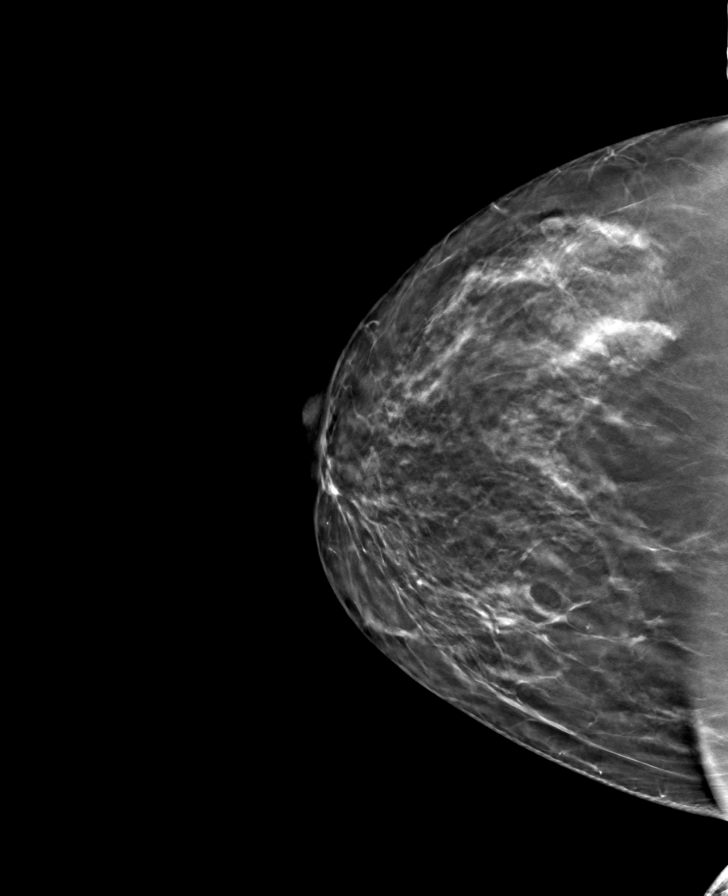

[R MLO tomo · tomo slice 45/90.0]
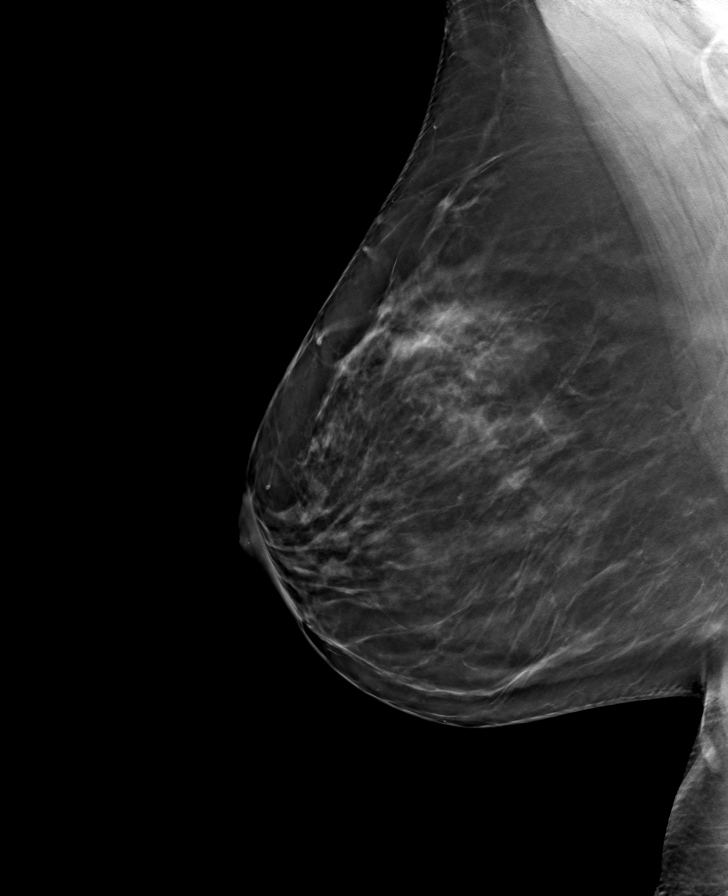

[L MLO tomo · tomo slice 45/88.0]
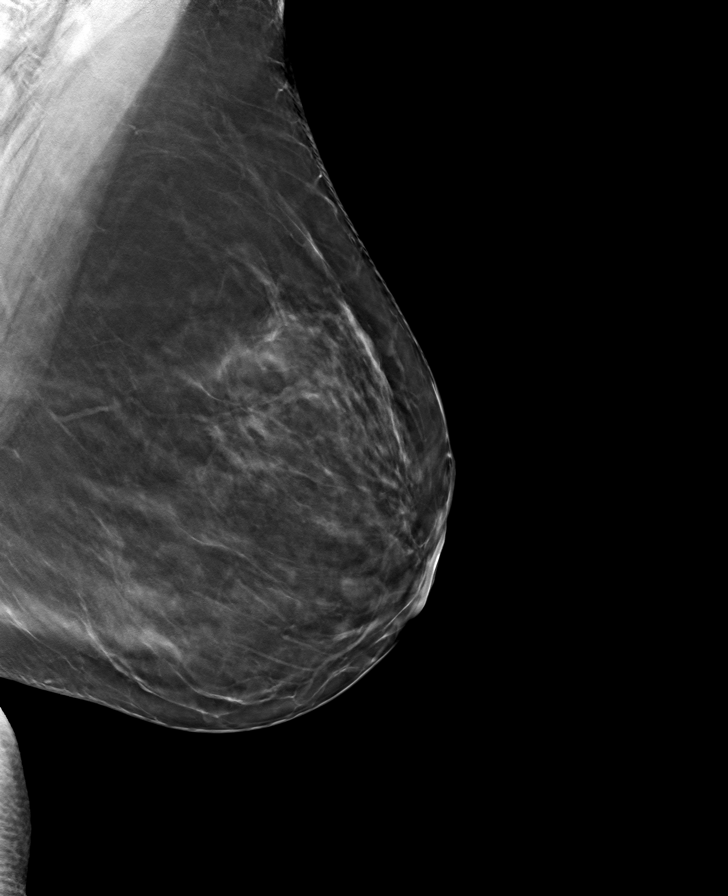

[L CC tomo · tomo slice 45/89.0]
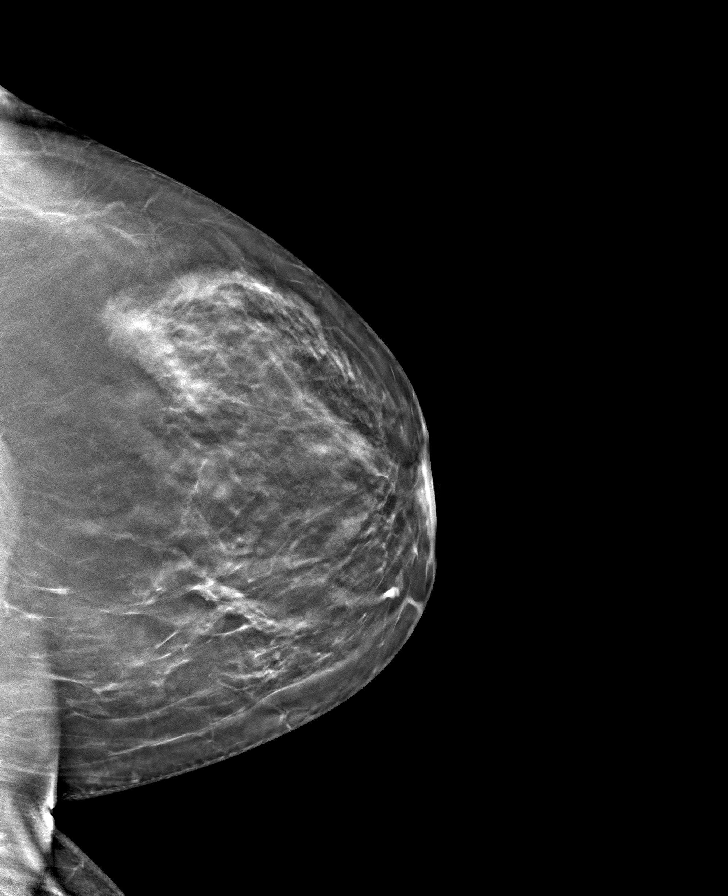

[8 of 24 positions shown; findings below may reference images not displayed]

ACR Breast Density Category c: The breast tissue is heterogeneously
dense, which may obscure small masses.
FINDINGS: There are no findings suspicious for malignancy. Images were
processed with CAD.
IMPRESSION: No mammographic evidence of malignancy. A result letter of this
screening mammogram will be mailed directly to the patient.

RECOMMENDATION:
Screening mammogram in one year. (Code:[id number]-[id number])

BI-RADS CATEGORY  1: Negative.

## 2021-02-10 ENCOUNTER — Ambulatory Visit: Payer: BC Managed Care – PPO | Admitting: Dermatology

## 2021-05-12 ENCOUNTER — Other Ambulatory Visit: Payer: Self-pay | Admitting: Obstetrics & Gynecology

## 2021-05-12 DIAGNOSIS — Z1231 Encounter for screening mammogram for malignant neoplasm of breast: Secondary | ICD-10-CM

## 2021-07-19 ENCOUNTER — Ambulatory Visit
Admission: RE | Admit: 2021-07-19 | Discharge: 2021-07-19 | Disposition: A | Discharge status: Home / Self Care | Payer: BC Managed Care – PPO | Source: Ambulatory Visit | Attending: Obstetrics & Gynecology | Admitting: Obstetrics & Gynecology

## 2021-07-19 ENCOUNTER — Other Ambulatory Visit: Payer: Self-pay

## 2021-07-19 DIAGNOSIS — Z1231 Encounter for screening mammogram for malignant neoplasm of breast: Secondary | ICD-10-CM | POA: Insufficient documentation

## 2021-09-10 ENCOUNTER — Ambulatory Visit (INDEPENDENT_AMBULATORY_CARE_PROVIDER_SITE_OTHER): Payer: BC Managed Care – PPO | Admitting: Obstetrics & Gynecology

## 2021-09-10 ENCOUNTER — Other Ambulatory Visit: Payer: Self-pay

## 2021-09-10 ENCOUNTER — Other Ambulatory Visit (HOSPITAL_COMMUNITY)
Admission: RE | Admit: 2021-09-10 | Discharge: 2021-09-10 | Disposition: A | Discharge status: Home / Self Care | Payer: BC Managed Care – PPO | Source: Ambulatory Visit | Attending: Obstetrics & Gynecology | Admitting: Obstetrics & Gynecology

## 2021-09-10 ENCOUNTER — Encounter: Payer: Self-pay | Admitting: Obstetrics & Gynecology

## 2021-09-10 VITALS — BP 118/70 | HR 74 | Resp 16 | Ht 65.75 in | Wt 217.0 lb

## 2021-09-10 DIAGNOSIS — Z975 Presence of (intrauterine) contraceptive device: Secondary | ICD-10-CM | POA: Diagnosis not present

## 2021-09-10 DIAGNOSIS — E039 Hypothyroidism, unspecified: Secondary | ICD-10-CM

## 2021-09-10 DIAGNOSIS — Z01419 Encounter for gynecological examination (general) (routine) without abnormal findings: Secondary | ICD-10-CM | POA: Insufficient documentation

## 2021-09-10 DIAGNOSIS — Z803 Family history of malignant neoplasm of breast: Secondary | ICD-10-CM

## 2021-09-10 NOTE — Progress Notes (Signed)
Amy Peters 01-Apr-1971 [id number]   History:    51 y.o. G4P2A2L2 Married   RP:  Established patient presenting for annual gyn exam    HPI: Well on Mirena IUD since May 2018.  No BTB.  No pelvic pain. No pain with IC.  Pap 08/2019 Neg.  Breasts normal. Mammo Neg 06/2021. BMI 35.29.  Fam [facility]. Hypothyroidism on Synthroid.    Past medical history,surgical history, family history and social history were all reviewed and documented in the EPIC chart.  Gynecologic History No LMP recorded. (Menstrual status: IUD).  Obstetric History OB History  Gravida Para Term Preterm AB Living  4 2 1 1 2 2   SAB IAB Ectopic Multiple Live Births  2       2    # Outcome Date GA Lbr Len/2nd Weight Sex Delivery Anes PTL Lv  4 SAB           3 Preterm     M Vag-Spont  Y LIV  2 Term     M Vag-Spont  N LIV  1 SAB             Obstetric Comments  Age first menstrual cycle 34  Age first pregnancy 19  IUD 2008     ROS: A ROS was performed and pertinent positives and negatives are included in the history.  GENERAL: No fevers or chills. HEENT: No change in vision, no earache, sore throat or sinus congestion. NECK: No pain or stiffness. CARDIOVASCULAR: No chest pain or pressure. No palpitations. PULMONARY: No shortness of breath, cough or wheeze. GASTROINTESTINAL: No abdominal pain, nausea, vomiting or diarrhea, melena or bright red blood per rectum. GENITOURINARY: No urinary frequency, urgency, hesitancy or dysuria. MUSCULOSKELETAL: No joint or muscle pain, no back pain, no recent trauma. DERMATOLOGIC: No rash, no itching, no lesions. ENDOCRINE: No polyuria, polydipsia, no heat or cold intolerance. No recent change in weight. HEMATOLOGICAL: No anemia or easy bruising or bleeding. NEUROLOGIC: No headache, seizures, numbness, tingling or weakness. PSYCHIATRIC: No depression, no loss of interest in normal activity or change in sleep pattern.     Exam:   BP 118/70    Pulse 74    Resp 16    Ht  5' 5.75" (1.67 m)    Wt 217 lb (98.4 kg)    BMI 35.29 kg/m   Body mass index is 35.29 kg/m.  General appearance : Well developed well nourished female. No acute distress HEENT: Eyes: no retinal hemorrhage or exudates,  Neck supple, trachea midline, no carotid bruits, no thyroidmegaly Lungs: Clear to auscultation, no rhonchi or wheezes, or rib retractions  Heart: Regular rate and rhythm, no murmurs or gallops Breast:Examined in sitting and supine position were symmetrical in appearance, no palpable masses or tenderness,  no skin retraction, no nipple inversion, no nipple discharge, no skin discoloration, no axillary or supraclavicular lymphadenopathy Abdomen: no palpable masses or tenderness, no rebound or guarding Extremities: no edema or skin discoloration or tenderness  Pelvic: Vulva: Normal             Vagina: No gross lesions or discharge  Cervix: No gross lesions or discharge.  [facility]UD strings visible at [facility].  Pap reflex done.  Uterus  AV, normal size, shape and consistency, non-tender and mobile  Adnexa  Without masses or tenderness  Anus: Normal   Assessment/Plan:  51 y.o. female for annual exam   1. Encounter for routine gynecological examination with Papanicolaou smear of cervix Well on  Mirena IUD since May 2018.  No BTB.  No pelvic pain. No pain with IC.  Pap 08/2019 Neg.  Breasts normal. Mammo Neg 06/2021. BMI 35.29.  Fam MD for [facility] Labs. Hypothyroidism on Synthroid.  Recommend Colonoscopy, [facility] as a second choice. - Cytology - PAP( [place])  2. IUD (intrauterine device) in place Well on Mirena IUD since 12/2016.  IUD well tolerated and in good position. Will keep until 7 yrs.  3. Family history of breast cancer Mother with Breast Ca >25 yo.  4. Acquired hypothyroidism On Synthroid.  Other orders - acyclovir (ZOVIRAX) 800 MG tablet; Take 800 mg by mouth as needed. - escitalopram (LEXAPRO) 20 MG tablet; Take 20 mg by mouth daily.   Princess Bruins MD, 1:56 PM  09/10/2021

## 2021-09-13 LAB — CYTOLOGY - PAP: Diagnosis: NEGATIVE

## 2022-06-06 ENCOUNTER — Ambulatory Visit: Payer: BC Managed Care – PPO | Admitting: Surgery

## 2022-06-06 ENCOUNTER — Encounter: Payer: Self-pay | Admitting: Surgery

## 2022-06-06 VITALS — BP 146/85 | HR 62 | Temp 98.3°F | Wt 234.0 lb

## 2022-06-06 DIAGNOSIS — K8020 Calculus of gallbladder without cholecystitis without obstruction: Secondary | ICD-10-CM | POA: Diagnosis not present

## 2022-06-06 NOTE — H&P (View-Only) (Signed)
06/06/2022  History of Present Illness: Amy Peters is a 51 y.o. female presenting for evaluation of RUQ abdominal pain.  The patient has a history of gallstones diagnosed in 2018.  Since then, she reports that initially her symptoms were very sporadic and did not think much of them.  Recently she had a pretty severe episode of biliary colic  with pain in the right upper quadrant area, associated with nausea and vomiting.  The pain was very severe, and she reports she almost considered going to the ER, but her pain eventually subsided.  Denies any fevers, chills, chest pain, shortness of breath.   Prior to that episode, she had a less severe episode about 6 months prior, but also had two fairly close episodes before that.    Past Medical History: Past Medical History:  Diagnosis Date   Anxiety    Breast fibroadenoma    LEFT   Bronchitis    Depression    HSV-1 infection    Hypothyroid 2005   IUFD (intrauterine fetal death)    @ 24 WEEKS   Melanoma in situ (HCC) 01/08/2020   L upper arm above antecubital   Melanoma in situ (HCC)    Personal history of tobacco use, presenting hazards to health      Past Surgical History: Past Surgical History:  Procedure Laterality Date   BREAST EXCISIONAL BIOPSY Left 07/13/2012   neg   BREAST EXCISIONAL BIOPSY Left 2011   neg   BREAST SURGERY Left 2009, 2013   excision left breast lesion   DILATION AND CURETTAGE OF UTERUS  1996   DILATION AND CURETTAGE OF UTERUS  1996, 2002   HYSTEROSCOPY  2007   HYST., D&C/POLYP   INTRAUTERINE DEVICE (IUD) INSERTION     mirena removed & mirena insertion 12-22-16   INTRAUTERINE DEVICE INSERTION  11/2011   Mirena   PILONIDAL CYST EXCISION  1994   RADIOACTIVE IODINE ABLATION  12/05/2003   OF THYROID/20 MILLICURIES OF I-131-DR. BALAN    Home Medications: Prior to Admission medications   Medication Sig Start Date End Date Taking? Authorizing Provider  acyclovir (ZOVIRAX) 800 MG tablet Take 800 mg by  mouth as needed. 04/27/21  Yes [provider]  escitalopram (LEXAPRO) 20 MG tablet Take 20 mg by mouth daily. 07/06/21  Yes [provider]  levonorgestrel (MIRENA) 20 MCG/24HR IUD by Intrauterine route.   Yes [provider]  levothyroxine (SYNTHROID, LEVOTHROID) 125 MCG tablet Take 1 tablet (125 mcg total) by mouth daily before breakfast. 09/11/17  Yes Lavoie, Marie-Lyne, MD    Allergies: No Known Allergies  Review of Systems: Review of Systems  Constitutional:  Negative for chills and fever.  HENT:  Negative for hearing loss.   Respiratory:  Negative for shortness of breath.   Cardiovascular:  Negative for chest pain.  Gastrointestinal:  Positive for abdominal pain, nausea and vomiting.  Genitourinary:  Negative for dysuria.  Musculoskeletal:  Negative for myalgias.  Skin:  Negative for rash.  Neurological:  Negative for dizziness.  Psychiatric/Behavioral:  Negative for depression.     Physical Exam BP (!) 146/85   Pulse 62   Temp 98.3 F (36.8 C) (Oral)   Wt 234 lb (106.1 kg)   SpO2 97%   BMI 38.06 kg/m  CONSTITUTIONAL: No acute distress, well nourished. HEENT:  Normocephalic, atraumatic, extraocular motion intact. NECK:  Trachea is midline, no jugular venous distention. RESPIRATORY:  Lungs are clear, and breath sounds are equal bilaterally. Normal respiratory effort without pathologic   use of accessory muscles. CARDIOVASCULAR: Heart is regular without murmurs, gallops, or rubs. GI: The abdomen is soft, non-distended, with some discomfort in the RUQ, but negative Murphy's sign. MUSCULOSKELETAL:  Normal gait, no peripheral edema. NEUROLOGIC:  Motor and sensation is grossly normal.  Cranial nerves are grossly intact. PSYCH:  Alert and oriented to person, place and time. Affect is normal.  Labs/Imaging: Ultrasound RUQ 09/05/2016: Impression:    1. There are several mobile gallstones within the gallbladder. No evidence on ultrasound for acute  inflammation. No bile duct dilatation.  2. Remainder of the exam is negative  Labs on 06/17/21: Na 138, K 4.3, Cl 103, CO2 28, BUN 16, Cr 0.8.  Total bili 0.5, AST 17, ALT 22, Alk Phos 56, Albumin 4.  WBC 7.5, Hgb 12.5, Hct 39.5, Plt 251.  Assessment and Plan: This is a 51 y.o. female with symptomatic cholelithiasis.  --Discussed with the patient the findings on her past ultrasound and that likely either she has more stones or they may be larger in size, and more likely to cause episodes of biliary colic.  Discussed with her the options of conservative management with dietary changes, but she's still at risk of episodes of biliary colic or cholecystitis.  Discussed with her the option for medical management but unfortunately medications do not work well and take a long time to dissolve stones.  Discussed with her also the option for surgical management with robotic assisted cholecystectomy.  She has opted for this. --Discussed with her the surgery at length and reviewed the incisions, risks of bleeding, infection, injury to surrounding structures, the use of ICG, that this is an outpatient surgery, post-operative activity restrictions, pain control, and she's willing to proceed. --Will schedule her for surgery on 06/21/22.  All of her questions have been answered.  I spent 40 minutes dedicated to the care of this patient on the date of this encounter to include pre-visit review of records, face-to-face time with the patient discussing diagnosis and management, and any post-visit coordination of care.   Ajmal Kathan Luis Erinn Huskins, MD [facility]     

## 2022-06-06 NOTE — Progress Notes (Signed)
06/06/2022  History of Present Illness: Amy Peters is a 51 y.o. female presenting for evaluation of RUQ abdominal pain.  The patient has a history of gallstones diagnosed in 2018.  Since then, she reports that initially her symptoms were very sporadic and did not think much of them.  Recently she had a pretty severe episode of biliary colic  with pain in the right upper quadrant area, associated with nausea and vomiting.  The pain was very severe, and she reports she almost considered going to the ER, but her pain eventually subsided.  Denies any fevers, chills, chest pain, shortness of breath.   Prior to that episode, she had a less severe episode about 6 months prior, but also had two fairly close episodes before that.    Past Medical History: Past Medical History:  Diagnosis Date   Anxiety    Breast fibroadenoma    LEFT   Bronchitis    Depression    HSV-1 infection    Hypothyroid 2005   IUFD (intrauterine fetal death)    @ 72 WEEKS   Melanoma in situ ([facility]) 01/08/2020   L upper arm above antecubital   Melanoma in situ [facility])    Personal history of tobacco use, presenting hazards to health      Past Surgical History: Past Surgical History:  Procedure Laterality Date   BREAST EXCISIONAL BIOPSY Left 07/13/2012   neg   BREAST EXCISIONAL BIOPSY Left 2011   neg   BREAST SURGERY Left 2009, 2013   excision left breast lesion   [place], 2002   HYSTEROSCOPY  2007   HYST., D&C/POLYP   INTRAUTERINE DEVICE (IUD) INSERTION     mirena removed & mirena insertion 12-22-16   INTRAUTERINE DEVICE INSERTION  11/2011   Mirena   PILONIDAL CYST EXCISION  1994   RADIOACTIVE IODINE ABLATION  12/05/2003   OF FUXNATF/57 MILLICURIES OF D-220-UR. BALAN    Home Medications: Prior to Admission medications   Medication Sig Start Date End Date Taking? Authorizing Provider  acyclovir (ZOVIRAX) 800 MG tablet Take 800 mg by  mouth as needed. 04/27/21  Yes [provider]  escitalopram (LEXAPRO) 20 MG tablet Take 20 mg by mouth daily. 07/06/21  Yes [provider]  levonorgestrel (MIRENA) 20 MCG/24HR IUD by Intrauterine route.   Yes [provider]  levothyroxine (SYNTHROID, LEVOTHROID) 125 MCG tablet Take 1 tablet (125 mcg total) by mouth daily before breakfast. 09/11/17  Yes Princess Bruins, MD    Allergies: No Known Allergies  Review of Systems: Review of Systems  Constitutional:  Negative for chills and fever.  HENT:  Negative for hearing loss.   Respiratory:  Negative for shortness of breath.   Cardiovascular:  Negative for chest pain.  Gastrointestinal:  Positive for abdominal pain, nausea and vomiting.  Genitourinary:  Negative for dysuria.  Musculoskeletal:  Negative for myalgias.  Skin:  Negative for rash.  Neurological:  Negative for dizziness.  Psychiatric/Behavioral:  Negative for depression.     Physical Exam BP (!) 146/85   Pulse 62   Temp 98.3 F (36.8 C) (Oral)   Wt 234 lb (106.1 kg)   SpO2 97%   BMI 38.06 kg/m  CONSTITUTIONAL: No acute distress, well nourished. HEENT:  Normocephalic, atraumatic, extraocular motion intact. NECK:  Trachea is midline, no jugular venous distention. RESPIRATORY:  Lungs are clear, and breath sounds are equal bilaterally. Normal respiratory effort without pathologic  use of accessory muscles. CARDIOVASCULAR: Heart is regular without murmurs, gallops, or rubs. GI: The abdomen is soft, non-distended, with some discomfort in the RUQ, but negative Murphy's sign. MUSCULOSKELETAL:  Normal gait, no peripheral edema. NEUROLOGIC:  Motor and sensation is grossly normal.  Cranial nerves are grossly intact. PSYCH:  Alert and oriented to person, place and time. Affect is normal.  Labs/Imaging: Ultrasound RUQ 09/05/2016: Impression:    1. There are several mobile gallstones within the gallbladder. No evidence on ultrasound for acute  inflammation. No bile duct dilatation.  2. Remainder of the exam is negative  Labs on 06/17/21: Na 138, K 4.3, Cl 103, CO2 28, BUN 16, Cr 0.8.  Total bili 0.5, AST 17, ALT 22, Alk Phos 56, Albumin 4.  WBC 7.5, Hgb 12.5, Hct 39.5, Plt 251.  Assessment and Plan: This is a 51 y.o. female with symptomatic cholelithiasis.  --Discussed with the patient the findings on her past ultrasound and that likely either she has more stones or they may be larger in size, and more likely to cause episodes of biliary colic.  Discussed with her the options of conservative management with dietary changes, but she's still at risk of episodes of biliary colic or cholecystitis.  Discussed with her the option for medical management but unfortunately medications do not work well and take a long time to dissolve stones.  Discussed with her also the option for surgical management with robotic assisted cholecystectomy.  She has opted for this. --Discussed with her the surgery at length and reviewed the incisions, risks of bleeding, infection, injury to surrounding structures, the use of ICG, that this is an outpatient surgery, post-operative activity restrictions, pain control, and she's willing to proceed. --Will schedule her for surgery on 06/21/22.  All of her questions have been answered.  I spent 40 minutes dedicated to the care of this patient on the date of this encounter to include pre-visit review of records, face-to-face time with the patient discussing diagnosis and management, and any post-visit coordination of care.   Melvyn Neth, [facility]

## 2022-06-06 NOTE — Patient Instructions (Signed)
Our surgery scheduler Barbara will call you within 24-48 hours to get you scheduled. If you have not heard from her after 48 hours, please call our office. Have the blue sheet available when she calls to write down important information.   If you have any concerns or questions, please feel free to call our office.    Minimally Invasive Cholecystectomy  Minimally invasive cholecystectomy is surgery to remove the gallbladder. The gallbladder is a pear-shaped organ that lies beneath the liver on the right side of the body. The gallbladder stores bile, which is a fluid that helps the body digest fats. Cholecystectomy is often done to treat inflammation (irritation and swelling) of the gallbladder (cholecystitis). This condition is usually caused by a buildup of gallstones (cholelithiasis) in the gallbladder or when the fluid in the gall bladder becomes stagnant because gallstones get stuck in the ducts (tubes) and block the flow of bile. This can result in inflammation and pain. In severe cases, emergency surgery may be required. This procedure is done through small incisions in the abdomen, instead of one large incision. It is also called laparoscopic surgery. A thin scope with a camera (laparoscope) is inserted through one incision. Then surgical instruments are inserted through the other incisions. In some cases, a minimally invasive surgery may need to be changed to a surgery that is done through a larger incision. This is called open surgery. Tell a health care provider about: Any allergies you have. All medicines you are taking, including vitamins, herbs, eye drops, creams, and over-the-counter medicines. Any problems you or family members have had with anesthetic medicines. Any bleeding problems you have. Any surgeries you have had. Any medical conditions you have. Whether you are pregnant or may be pregnant. What are the risks? Generally, this is a safe procedure. However, problems may occur,  including: Infection. Bleeding. Allergic reactions to medicines. Damage to nearby structures or organs. A gallstone remaining in the common bile duct. The common bile duct carries bile from the gallbladder to the small intestine. A bile leak from the liver or cystic duct after your gallbladder is removed. What happens before the procedure? When to stop eating and drinking Follow instructions from your health care provider about what you may eat and drink before your procedure. These may include: 8 hours before the procedure Stop eating most foods. Do not eat meat, fried foods, or fatty foods. Eat only light foods, such as toast or crackers. All liquids are okay except energy drinks and alcohol. 6 hours before the procedure Stop eating. Drink only clear liquids, such as water, clear fruit juice, black coffee, plain tea, and sports drinks. Do not drink energy drinks or alcohol. 2 hours before the procedure Stop drinking all liquids. You may be allowed to take medicines with small sips of water. If you do not follow your health care provider's instructions, your procedure may be delayed or canceled. Medicines Ask your health care provider about: Changing or stopping your regular medicines. This is especially important if you are taking diabetes medicines or blood thinners. Taking medicines such as aspirin and ibuprofen. These medicines can thin your blood. Do not take these medicines unless your health care provider tells you to take them. Taking over-the-counter medicines, vitamins, herbs, and supplements. General instructions If you will be going home right after the procedure, plan to have a responsible adult: Take you home from the hospital or clinic. You will not be allowed to drive. Care for you for the time you are   told. Do not use any products that contain nicotine or tobacco for at least 4 weeks before the procedure. These products include cigarettes, chewing tobacco, and vaping  devices, such as e-cigarettes. If you need help quitting, ask your health care provider. Ask your health care provider: How your surgery site will be marked. What steps will be taken to help prevent infection. These may include: Removing hair at the surgery site. Washing skin with a germ-killing soap. Taking antibiotic medicine. What happens during the procedure?  An IV will be inserted into one of your veins. You will be given one or both of the following: A medicine to help you relax (sedative). A medicine to make you fall asleep (general anesthetic). Your surgeon will make several small incisions in your abdomen. The laparoscope will be inserted through one of the small incisions. The camera on the laparoscope will send images to a monitor in the operating room. This lets your surgeon see inside your abdomen. A gas will be pumped into your abdomen. This will expand your abdomen to give the surgeon more room to perform the surgery. Other tools that are needed for the procedure will be inserted through the other incisions. The gallbladder will be removed through one of the incisions. Your common bile duct may be examined. If stones are found in the common bile duct, they may be removed. After your gallbladder has been removed, the incisions will be closed with stitches (sutures), staples, or skin glue. Your incisions will be covered with a bandage (dressing). The procedure may vary among health care providers and hospitals. What happens after the procedure? Your blood pressure, heart rate, breathing rate, and blood oxygen level will be monitored until you leave the hospital or clinic. You will be given medicines as needed to control your pain. You may have a drain placed in the incision. The drain will be removed a day or two after the procedure. Summary Minimally invasive cholecystectomy, also called laparoscopic cholecystectomy, is surgery to remove the gallbladder using small  incisions. Tell your health care provider about all the medical conditions you have and all the medicines you are taking for those conditions. Before the procedure, follow instructions about when to stop eating and drinking and changing or stopping medicines. Plan to have a responsible adult care for you for the time you are told after you leave the hospital or clinic. This information is not intended to replace advice given to you by your health care provider. Make sure you discuss any questions you have with your health care provider. Document Revised: 02/09/2021 Document Reviewed: 02/09/2021 Elsevier Patient Education  2023 [employer]  

## 2022-06-07 ENCOUNTER — Telehealth: Payer: Self-pay | Admitting: Surgery

## 2022-06-07 NOTE — Telephone Encounter (Signed)
Patient has been advised of Pre-Admission date/time, and Surgery date at [facility].  Surgery Date: 06/21/22 Preadmission Testing Date: 06/14/22 (phone 8a-1p)  Patient has been made aware to call [phone], between 1-3:00pm the day before surgery, to find out what time to arrive for surgery.

## 2022-06-14 ENCOUNTER — Encounter
Admission: RE | Admit: 2022-06-14 | Discharge: 2022-06-14 | Disposition: A | Discharge status: Home / Self Care | Payer: BC Managed Care – PPO | Source: Ambulatory Visit | Attending: Surgery | Admitting: Surgery

## 2022-06-14 ENCOUNTER — Other Ambulatory Visit: Payer: Self-pay | Admitting: Obstetrics & Gynecology

## 2022-06-14 VITALS — Ht 65.75 in | Wt 233.9 lb

## 2022-06-14 DIAGNOSIS — Z1231 Encounter for screening mammogram for malignant neoplasm of breast: Secondary | ICD-10-CM

## 2022-06-14 DIAGNOSIS — Z01818 Encounter for other preprocedural examination: Secondary | ICD-10-CM

## 2022-06-14 HISTORY — DX: Anemia, unspecified: D64.9

## 2022-06-14 HISTORY — DX: Gastro-esophageal reflux disease without esophagitis: K21.9

## 2022-06-14 NOTE — Patient Instructions (Signed)
Your procedure is scheduled on:06-21-22 Tuesday Report to the Registration Desk on the 1st floor of the [facility].Then proceed to the 2nd floor Surgery Desk To find out your arrival time, please call [phone] between 1PM - 3PM on:06-20-22 Monday If your arrival time is 6:00 am, do not arrive prior to that time as the [facility] entrance doors do not open until 6:00 am.  REMEMBER: Instructions that are not followed completely may result in serious medical risk, up to and including death; or upon the discretion of your surgeon and anesthesiologist your surgery may need to be rescheduled.  Do not eat food after midnight the night before surgery.  No gum chewing, lozengers or hard candies.  You may however, drink CLEAR liquids up to 2 hours before you are scheduled to arrive for your surgery. Do not drink anything within 2 hours of your scheduled arrival time.  Clear liquids include: - water  - apple juice without pulp - gatorade (not RED colors) - black coffee or tea (Do NOT add milk or creamers to the coffee or tea) Do NOT drink anything that is not on this list.  TAKE THESE MEDICATIONS THE MORNING OF SURGERY WITH A SIP OF WATER: -escitalopram (LEXAPRO)  -levothyroxine (SYNTHROID, LEVOTHROID)  One week prior to surgery: Stop Anti-inflammatories (NSAIDS) such as Advil, Aleve, Ibuprofen, Motrin, Naproxen, Naprosyn and Aspirin based products such as Excedrin, Goodys Powder, BC Powder.You may however, continue to take Tylenol if needed for pain up until the day of surgery.  Stop ANY OVER THE COUNTER supplements/vitamins NOW (06-14-22) until after surgery.  No Alcohol for 24 hours before or after surgery.  No Smoking including e-cigarettes for 24 hours prior to surgery.  No chewable tobacco products for at least 6 hours prior to surgery.  No nicotine patches on the day of surgery.  Do not use any "recreational" drugs for at least a week prior to your surgery.  Please be  advised that the combination of cocaine and anesthesia may have negative outcomes, up to and including death. If you test positive for cocaine, your surgery will be cancelled.  On the morning of surgery brush your teeth with toothpaste and water, you may rinse your mouth with mouthwash if you wish. Do not swallow any toothpaste or mouthwash.  Use CHG Soap as directed on instruction sheet.  Do not wear jewelry, make-up, hairpins, clips or nail polish.  Do not wear lotions, powders, or perfumes.   Do not shave body from the neck down 48 hours prior to surgery just in case you cut yourself which could leave a site for infection.  Also, freshly shaved skin may become irritated if using the CHG soap.  Contact lenses, hearing aids and dentures may not be worn into surgery.  Do not bring valuables to the hospital. [facility] is not responsible for any missing/lost belongings or valuables.    Notify your doctor if there is any change in your medical condition (cold, fever, infection).  Wear comfortable clothing (specific to your surgery type) to the hospital.  After surgery, you can help prevent lung complications by doing breathing exercises.  Take deep breaths and cough every 1-2 hours. Your doctor may order a device called an Incentive Spirometer to help you take deep breaths. When coughing or sneezing, hold a pillow firmly against your incision with both hands. This is called "splinting." Doing this helps protect your incision. It also decreases belly discomfort.  If you are being admitted to the hospital  overnight, leave your suitcase in the car. After surgery it may be brought to your room.  If you are being discharged the day of surgery, you will not be allowed to drive home. You will need a responsible adult (18 years or older) to drive you home and stay with you that night.   If you are taking public transportation, you will need to have a responsible adult (18 years or older) with  you. Please confirm with your physician that it is acceptable to use public transportation.   Please call the [facility] at [phone] if you have any questions about these instructions.  Surgery Visitation Policy:  Patients undergoing a surgery or procedure may have two family members or support persons with them as long as the person is not COVID-19 positive or experiencing its symptoms.

## 2022-06-21 ENCOUNTER — Other Ambulatory Visit: Payer: Self-pay

## 2022-06-21 ENCOUNTER — Encounter: Payer: Self-pay | Admitting: Surgery

## 2022-06-21 ENCOUNTER — Ambulatory Visit
Admission: RE | Admit: 2022-06-21 | Discharge: 2022-06-21 | Disposition: A | Discharge status: Home / Self Care | Payer: BC Managed Care – PPO | Attending: Surgery | Admitting: Surgery

## 2022-06-21 ENCOUNTER — Ambulatory Visit: Payer: BC Managed Care – PPO | Admitting: Anesthesiology

## 2022-06-21 ENCOUNTER — Encounter
Admission: RE | Disposition: A | Discharge status: Home / Self Care | Payer: Self-pay | Source: Home / Self Care | Attending: Surgery

## 2022-06-21 DIAGNOSIS — K8020 Calculus of gallbladder without cholecystitis without obstruction: Secondary | ICD-10-CM

## 2022-06-21 DIAGNOSIS — K219 Gastro-esophageal reflux disease without esophagitis: Secondary | ICD-10-CM | POA: Insufficient documentation

## 2022-06-21 DIAGNOSIS — K8010 Calculus of gallbladder with chronic cholecystitis without obstruction: Secondary | ICD-10-CM | POA: Insufficient documentation

## 2022-06-21 DIAGNOSIS — Z87891 Personal history of nicotine dependence: Secondary | ICD-10-CM | POA: Diagnosis not present

## 2022-06-21 DIAGNOSIS — Z01818 Encounter for other preprocedural examination: Secondary | ICD-10-CM

## 2022-06-21 DIAGNOSIS — E039 Hypothyroidism, unspecified: Secondary | ICD-10-CM | POA: Insufficient documentation

## 2022-06-21 LAB — POCT PREGNANCY, URINE: Preg Test, Ur: NEGATIVE

## 2022-06-21 SURGERY — CHOLECYSTECTOMY, ROBOT-ASSISTED, LAPAROSCOPIC
Anesthesia: General | Site: Abdomen

## 2022-06-21 MED ORDER — ROCURONIUM BROMIDE 100 MG/10ML IV SOLN
INTRAVENOUS | Status: DC | PRN
  Administered 2022-06-21: 20 mg via INTRAVENOUS
  Administered 2022-06-21: 50 mg via INTRAVENOUS

## 2022-06-21 MED ORDER — MIDAZOLAM HCL 2 MG/2ML IJ SOLN
INTRAMUSCULAR | Status: AC
  Filled 2022-06-21: qty 2

## 2022-06-21 MED ORDER — LACTATED RINGERS IV SOLN: INTRAVENOUS | Status: DC | PRN

## 2022-06-21 MED ORDER — CHLORHEXIDINE GLUCONATE CLOTH 2 % EX PADS: 6.0000 | MEDICATED_PAD | Freq: Once | CUTANEOUS | Status: DC

## 2022-06-21 MED ORDER — OXYCODONE HCL 5 MG PO TABS: 5.0000 mg | ORAL_TABLET | ORAL | 0 refills | Status: DC | PRN

## 2022-06-21 MED ORDER — OXYCODONE HCL 5 MG PO TABS: 5.0000 mg | ORAL_TABLET | Freq: Once | ORAL | Status: DC | PRN

## 2022-06-21 MED ORDER — ACETAMINOPHEN 500 MG PO TABS
1000.0000 mg | ORAL_TABLET | ORAL | Status: AC
  Administered 2022-06-21: 1000 mg via ORAL

## 2022-06-21 MED ORDER — LIDOCAINE HCL (CARDIAC) PF 100 MG/5ML IV SOSY
PREFILLED_SYRINGE | INTRAVENOUS | Status: DC | PRN
  Administered 2022-06-21: 50 mg via INTRAVENOUS

## 2022-06-21 MED ORDER — LACTATED RINGERS IV SOLN: INTRAVENOUS | Status: DC

## 2022-06-21 MED ORDER — GABAPENTIN 300 MG PO CAPS
ORAL_CAPSULE | ORAL | Status: AC
  Administered 2022-06-21: 300 mg via ORAL
  Filled 2022-06-21: qty 1

## 2022-06-21 MED ORDER — BUPIVACAINE-EPINEPHRINE (PF) 0.25% -1:200000 IJ SOLN
INTRAMUSCULAR | Status: DC | PRN
  Administered 2022-06-21: 30 mL

## 2022-06-21 MED ORDER — FAMOTIDINE 20 MG PO TABS
ORAL_TABLET | ORAL | Status: AC
  Administered 2022-06-21: 20 mg via ORAL
  Filled 2022-06-21: qty 1

## 2022-06-21 MED ORDER — DEXAMETHASONE SODIUM PHOSPHATE 10 MG/ML IJ SOLN
INTRAMUSCULAR | Status: DC | PRN
  Administered 2022-06-21: 10 mg via INTRAVENOUS

## 2022-06-21 MED ORDER — FAMOTIDINE 20 MG PO TABS: 20.0000 mg | ORAL_TABLET | Freq: Once | ORAL | Status: AC

## 2022-06-21 MED ORDER — ONDANSETRON HCL 4 MG/2ML IJ SOLN
INTRAMUSCULAR | Status: DC | PRN
  Administered 2022-06-21: 4 mg via INTRAVENOUS

## 2022-06-21 MED ORDER — GLYCOPYRROLATE 0.2 MG/ML IJ SOLN
INTRAMUSCULAR | Status: DC | PRN
  Administered 2022-06-21: .2 mg via INTRAVENOUS

## 2022-06-21 MED ORDER — SUGAMMADEX SODIUM 200 MG/2ML IV SOLN
INTRAVENOUS | Status: DC | PRN
  Administered 2022-06-21: 200 mg via INTRAVENOUS

## 2022-06-21 MED ORDER — CEFAZOLIN SODIUM-DEXTROSE 2-4 GM/100ML-% IV SOLN
INTRAVENOUS | Status: AC
  Filled 2022-06-21: qty 100

## 2022-06-21 MED ORDER — FENTANYL CITRATE (PF) 100 MCG/2ML IJ SOLN: 25.0000 ug | INTRAMUSCULAR | Status: DC | PRN

## 2022-06-21 MED ORDER — HYDROMORPHONE HCL 1 MG/ML IJ SOLN
INTRAMUSCULAR | Status: AC
  Filled 2022-06-21: qty 1

## 2022-06-21 MED ORDER — CHLORHEXIDINE GLUCONATE 0.12 % MT SOLN: 15.0000 mL | Freq: Once | OROMUCOSAL | Status: AC

## 2022-06-21 MED ORDER — ACETAMINOPHEN 500 MG PO TABS: 1000.0000 mg | ORAL_TABLET | Freq: Four times a day (QID) | ORAL | Status: AC | PRN

## 2022-06-21 MED ORDER — KETOROLAC TROMETHAMINE 30 MG/ML IJ SOLN
INTRAMUSCULAR | Status: DC | PRN
  Administered 2022-06-21: 30 mg via INTRAVENOUS

## 2022-06-21 MED ORDER — GABAPENTIN 300 MG PO CAPS: 300.0000 mg | ORAL_CAPSULE | ORAL | Status: AC

## 2022-06-21 MED ORDER — ORAL CARE MOUTH RINSE: 15.0000 mL | Freq: Once | OROMUCOSAL | Status: AC

## 2022-06-21 MED ORDER — IBUPROFEN 800 MG PO TABS: 800.0000 mg | ORAL_TABLET | Freq: Three times a day (TID) | ORAL | 1 refills | Status: AC | PRN

## 2022-06-21 MED ORDER — OXYCODONE HCL 5 MG/5ML PO SOLN: 5.0000 mg | Freq: Once | ORAL | Status: DC | PRN

## 2022-06-21 MED ORDER — ACETAMINOPHEN 500 MG PO TABS
ORAL_TABLET | ORAL | Status: AC
  Filled 2022-06-21: qty 2

## 2022-06-21 MED ORDER — BUPIVACAINE-EPINEPHRINE (PF) 0.25% -1:200000 IJ SOLN
INTRAMUSCULAR | Status: AC
  Filled 2022-06-21: qty 30

## 2022-06-21 MED ORDER — EPHEDRINE SULFATE (PRESSORS) 50 MG/ML IJ SOLN
INTRAMUSCULAR | Status: DC | PRN
  Administered 2022-06-21: 5 mg via INTRAVENOUS
  Administered 2022-06-21: 10 mg via INTRAVENOUS

## 2022-06-21 MED ORDER — INDOCYANINE GREEN 25 MG IV SOLR
2.5000 mg | INTRAVENOUS | Status: AC
  Administered 2022-06-21: 2.5 mg via INTRAVENOUS
  Filled 2022-06-21: qty 1

## 2022-06-21 MED ORDER — CHLORHEXIDINE GLUCONATE 0.12 % MT SOLN
OROMUCOSAL | Status: AC
  Administered 2022-06-21: 15 mL via OROMUCOSAL
  Filled 2022-06-21: qty 15

## 2022-06-21 MED ORDER — CEFAZOLIN SODIUM-DEXTROSE 2-4 GM/100ML-% IV SOLN
2.0000 g | INTRAVENOUS | Status: AC
  Administered 2022-06-21: 2 g via INTRAVENOUS

## 2022-06-21 MED ORDER — FENTANYL CITRATE (PF) 100 MCG/2ML IJ SOLN
INTRAMUSCULAR | Status: DC | PRN
  Administered 2022-06-21: 25 ug via INTRAVENOUS
  Administered 2022-06-21: 50 ug via INTRAVENOUS

## 2022-06-21 MED ORDER — MIDAZOLAM HCL 2 MG/2ML IJ SOLN
INTRAMUSCULAR | Status: DC | PRN
  Administered 2022-06-21: 2 mg via INTRAVENOUS

## 2022-06-21 MED ORDER — 0.9 % SODIUM CHLORIDE (POUR BTL) OPTIME
TOPICAL | Status: DC | PRN
  Administered 2022-06-21: 500 mL

## 2022-06-21 MED ORDER — FENTANYL CITRATE (PF) 100 MCG/2ML IJ SOLN
INTRAMUSCULAR | Status: AC
  Filled 2022-06-21: qty 2

## 2022-06-21 MED ORDER — PROPOFOL 10 MG/ML IV BOLUS
INTRAVENOUS | Status: DC | PRN
  Administered 2022-06-21: 200 mg via INTRAVENOUS

## 2022-06-21 MED ORDER — DEXMEDETOMIDINE HCL IN NACL 80 MCG/20ML IV SOLN
INTRAVENOUS | Status: DC | PRN
  Administered 2022-06-21 (×2): 8 ug via BUCCAL

## 2022-06-21 MED ORDER — HYDROMORPHONE HCL 1 MG/ML IJ SOLN
INTRAMUSCULAR | Status: DC | PRN
  Administered 2022-06-21: 1 mg via INTRAVENOUS

## 2022-06-21 SURGICAL SUPPLY — 56 items
ADH SKN CLS APL DERMABOND .7 (GAUZE/BANDAGES/DRESSINGS) ×2
BAG PRESSURE INF REUSE 1000 (BAG) IMPLANT
CANNULA CAP OBTURATR AIRSEAL 8 (CAP) IMPLANT
CANNULA REDUC XI 12-8 STAPL (CANNULA) ×2
CANNULA REDUCER 12-8 DVNC XI (CANNULA) ×2 IMPLANT
CLIP LIGATING HEMO O LOK GREEN (MISCELLANEOUS) ×2 IMPLANT
CUP MEDICINE 2OZ PLAST GRAD ST (MISCELLANEOUS) ×2 IMPLANT
DERMABOND ADVANCED .7 DNX12 (GAUZE/BANDAGES/DRESSINGS) ×2 IMPLANT
DRAPE ARM DVNC X/XI (DISPOSABLE) ×8 IMPLANT
DRAPE COLUMN DVNC XI (DISPOSABLE) ×2 IMPLANT
DRAPE DA VINCI XI ARM (DISPOSABLE) ×8
DRAPE DA VINCI XI COLUMN (DISPOSABLE) ×2
ELECT CAUTERY BLADE TIP 2.5 (TIP) ×2
ELECT REM PT RETURN 9FT ADLT (ELECTROSURGICAL) ×2
ELECTRODE CAUTERY BLDE TIP 2.5 (TIP) ×2 IMPLANT
ELECTRODE REM PT RTRN 9FT ADLT (ELECTROSURGICAL) ×2 IMPLANT
GLOVE SURG SYN 7.0 (GLOVE) ×4 IMPLANT
GLOVE SURG SYN 7.0 PF PI (GLOVE) ×4 IMPLANT
GLOVE SURG SYN 7.5  E (GLOVE) ×4
GLOVE SURG SYN 7.5 E (GLOVE) ×4 IMPLANT
GLOVE SURG SYN 7.5 PF PI (GLOVE) ×4 IMPLANT
GOWN STRL REUS W/ TWL LRG LVL3 (GOWN DISPOSABLE) ×8 IMPLANT
GOWN STRL REUS W/TWL LRG LVL3 (GOWN DISPOSABLE) ×8
IRRIGATOR SUCT 8 DISP DVNC XI (IRRIGATION / IRRIGATOR) IMPLANT
IRRIGATOR SUCTION 8MM XI DISP (IRRIGATION / IRRIGATOR)
IV NS 1000ML (IV SOLUTION)
IV NS 1000ML BAXH (IV SOLUTION) IMPLANT
KIT PINK PAD W/HEAD ARE REST (MISCELLANEOUS) ×2
KIT PINK PAD W/HEAD ARM REST (MISCELLANEOUS) ×2 IMPLANT
LABEL OR SOLS (LABEL) ×2 IMPLANT
MANIFOLD NEPTUNE II (INSTRUMENTS) ×2 IMPLANT
NEEDLE HYPO 22GX1.5 SAFETY (NEEDLE) ×2 IMPLANT
NS IRRIG 500ML POUR BTL (IV SOLUTION) ×2 IMPLANT
OBTURATOR OPTICAL STANDARD 8MM (TROCAR) ×2
OBTURATOR OPTICAL STND 8 DVNC (TROCAR) ×2
OBTURATOR OPTICALSTD 8 DVNC (TROCAR) ×2 IMPLANT
PACK LAP CHOLECYSTECTOMY (MISCELLANEOUS) ×2 IMPLANT
PENCIL SMOKE EVACUATOR (MISCELLANEOUS) ×2 IMPLANT
SEAL CANN UNIV 5-8 DVNC XI (MISCELLANEOUS) ×6 IMPLANT
SEAL XI 5MM-8MM UNIVERSAL (MISCELLANEOUS) ×6
SET TUBE FILTERED XL AIRSEAL (SET/KITS/TRAYS/PACK) IMPLANT
SET TUBE SMOKE EVAC HIGH FLOW (TUBING) ×2 IMPLANT
SOLUTION ELECTROLUBE (MISCELLANEOUS) ×2 IMPLANT
SPIKE FLUID TRANSFER (MISCELLANEOUS) ×2 IMPLANT
SPONGE T-LAP 18X18 ~~LOC~~+RFID (SPONGE) IMPLANT
SPONGE T-LAP 4X18 ~~LOC~~+RFID (SPONGE) ×2 IMPLANT
STAPLER CANNULA SEAL DVNC XI (STAPLE) ×2 IMPLANT
STAPLER CANNULA SEAL XI (STAPLE) ×2
SUT MNCRL AB 4-0 PS2 18 (SUTURE) ×2 IMPLANT
SUT VIC AB 3-0 SH 27 (SUTURE)
SUT VIC AB 3-0 SH 27X BRD (SUTURE) IMPLANT
SUT VICRYL 0 AB UR-6 (SUTURE) ×4 IMPLANT
SYS BAG RETRIEVAL 10MM (BASKET) ×2
SYSTEM BAG RETRIEVAL 10MM (BASKET) ×2 IMPLANT
TRAP FLUID SMOKE EVACUATOR (MISCELLANEOUS) ×2 IMPLANT
WATER STERILE IRR 500ML POUR (IV SOLUTION) ×2 IMPLANT

## 2022-06-21 NOTE — Anesthesia Preprocedure Evaluation (Signed)
Anesthesia Evaluation  Patient identified by MRN, date of birth, ID band Patient awake    Reviewed: Allergy & Precautions, NPO status , Patient's Chart, lab work & pertinent test results  History of Anesthesia Complications Negative for: history of anesthetic complications  Airway Mallampati: III  TM Distance: >3 FB Neck ROM: full    Dental  (+) Chipped   Pulmonary neg shortness of breath, former smoker,    Pulmonary exam normal        Cardiovascular Exercise Tolerance: Good (-) angina(-) Past MI negative cardio ROS Normal cardiovascular exam     Neuro/Psych PSYCHIATRIC DISORDERS negative neurological ROS     GI/Hepatic Neg liver ROS, GERD  Controlled,  Endo/Other  Hypothyroidism   Renal/GU      Musculoskeletal   Abdominal   Peds  Hematology negative hematology ROS (+)   Anesthesia Other Findings Past Medical History: No date: Anemia     Comment:  ONLY with pregnancy No date: Anxiety No date: Breast fibroadenoma     Comment:  LEFT No date: Bronchitis No date: Depression No date: GERD (gastroesophageal reflux disease) 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 2005: Hypothyroid No date: IUFD (intrauterine fetal death)     Comment:  @ 60 WEEKS 01/08/2020: Melanoma in situ ([place])     Comment:  L upper arm above antecubital No date: Personal history of tobacco use, presenting hazards to health  Past Surgical History: 07/13/2012: BREAST EXCISIONAL BIOPSY; Left     Comment:  neg 2011: BREAST EXCISIONAL BIOPSY; Left     Comment:  neg 2009, 2013: BREAST SURGERY; Left     Comment:  excision left breast lesion 1996: [place], 2002: [place] OF UTERUS 2007: HYSTEROSCOPY     Comment:  HYST., D&C/POLYP No date: INTRAUTERINE DEVICE (IUD) INSERTION     Comment:  mirena removed & mirena insertion 12-22-16 11/2011: INTRAUTERINE DEVICE INSERTION     Comment:  Mirena 1994: PILONIDAL CYST  EXCISION 12/05/2003: RADIOACTIVE IODINE ABLATION     Comment:  OF ZOXWRUE/45 MILLICURIES OF [id number]. BALAN  BMI    Body Mass Index: 38.04 kg/m      Reproductive/Obstetrics negative OB ROS                             Anesthesia Physical Anesthesia Plan  ASA: 3  Anesthesia Plan: General ETT   Post-op Pain Management:    Induction: Intravenous  PONV Risk Score and Plan: Ondansetron, Dexamethasone, Midazolam and Treatment may vary due to age or medical condition  Airway Management Planned: Oral ETT  Additional Equipment:   Intra-op Plan:   Post-operative Plan: Extubation in OR  Informed Consent: I have reviewed the patients History and Physical, chart, labs and discussed the procedure including the risks, benefits and alternatives for the proposed anesthesia with the patient or authorized representative who has indicated his/her understanding and acceptance.     Dental Advisory Given  Plan Discussed with: Anesthesiologist, CRNA and Surgeon  Anesthesia Plan Comments: (Patient consented for risks of anesthesia including but not limited to:  - adverse reactions to medications - damage to eyes, teeth, lips or other oral mucosa - nerve damage due to positioning  - sore throat or hoarseness - Damage to heart, brain, nerves, lungs, other parts of body or loss of life  Patient voiced understanding.)        Anesthesia Quick Evaluation

## 2022-06-21 NOTE — Op Note (Signed)
  Procedure Date:  06/21/2022  Pre-operative Diagnosis:  Symptomatic cholelithiasis  Post-operative Diagnosis: Symptomatic cholelithiasis  Procedure:  Robotic assisted cholecystectomy with ICG FireFly cholangiogram  Surgeon:  Melvyn Neth, MD  Anesthesia:  General endotracheal  Estimated Blood Loss:  5 ml  Specimens:  gallbladder  Complications:  None  Indications for Procedure:  This is a 51 y.o. female who presents with abdominal pain and workup revealing symptomatic cholelithiasis.  The benefits, complications, treatment options, and expected outcomes were discussed with the patient. The risks of bleeding, infection, recurrence of symptoms, failure to resolve symptoms, bile duct damage, bile duct leak, retained common bile duct stone, bowel injury, and need for further procedures were all discussed with the patient and she was willing to proceed.  Description of Procedure: The patient was correctly identified in the preoperative area and brought into the operating room.  The patient was placed supine with VTE prophylaxis in place.  Appropriate time-outs were performed.  Anesthesia was induced and the patient was intubated.  Appropriate antibiotics were infused.  The abdomen was prepped and draped in a sterile fashion. An infraumbilical incision was made. A cutdown technique was used to enter the abdominal cavity without injury, and a 12 mm robotic port was inserted.  Pneumoperitoneum was obtained with appropriate opening pressures.  Three 8-mm ports were placed in the mid abdomen at the level of the umbilicus under direct visualization.  The DaVinci platform was docked, camera targeted, and instruments were placed under direct visualization.  The gallbladder was identified.  The fundus was grasped and retracted cephalad.  Adhesions were lysed bluntly and with electrocautery. The infundibulum was grasped and retracted laterally, exposing the peritoneum overlying the gallbladder.  This  was incised with electrocautery and extended on either side of the gallbladder.  FireFly cholangiogram was then obtained, and we were able to clearly identify the cystic duct and common bile duct.  The cystic duct and cystic artery were carefully dissected with combination of cautery and blunt dissection.  Both were clipped twice proximally and once distally, cutting in between.  The gallbladder was taken from the gallbladder fossa in a retrograde fashion with electrocautery. The gallbladder was placed in an Endocatch bag. The liver bed was inspected and any bleeding was controlled with electrocautery. The right upper quadrant was then inspected again revealing intact clips, no bleeding, and no ductal injury.  The 8 mm ports were removed under direct visualization and the 12 mm port was removed.  The Endocatch bag was brought out via the umbilical incision. The fascial opening was closed using 0 vicryl suture.  Local anesthetic was infused in all incisions and the incisions were closed with 4-0 Monocryl.  The wounds were cleaned and sealed with DermaBond.  The patient was emerged from anesthesia and extubated and brought to the recovery room for further management.  The patient tolerated the procedure well and all counts were correct at the end of the case.   Melvyn Neth, MD

## 2022-06-21 NOTE — Discharge Instructions (Addendum)
Discharge Instructions 1.  Patient may shower, but do not scrub wounds heavily and dab dry only. 2.  Do not submerge wounds in pool/tub until fully healed. 3.  Do not apply ointments or hydrogen peroxide to the wounds. 4.  May apply ice packs to the wounds for comfort. 5.  No heavy lifting or pushing of more than 10-15 lbs for 4 weeks. 6.  Do not drive while taking narcotics for pain control.  Prior to driving, make sure you are able to rotate right and left to look at blindspots without significant pain or discomfort.   AMBULATORY SURGERY  DISCHARGE INSTRUCTIONS   The drugs that you were given will stay in your system until tomorrow so for the next 24 hours you should not:  Drive an automobile Make any legal decisions Drink any alcoholic beverage   You may resume regular meals tomorrow.  Today it is better to start with liquids and gradually work up to solid foods.  You may eat anything you prefer, but it is better to start with liquids, then soup and crackers, and gradually work up to solid foods.   Please notify your doctor immediately if you have any unusual bleeding, trouble breathing, redness and pain at the surgery site, drainage, fever, or pain not relieved by medication.    Additional Instructions:   Please contact your physician with any problems or Same Day Surgery at [phone], Monday through Friday 6 am to 4 pm, or [place] at [facility] number at [phone].

## 2022-06-21 NOTE — Anesthesia Procedure Notes (Signed)
Procedure Name: Intubation Date/Time: 06/21/2022 11:19 AM  Performed by: Beverely Low, CRNAPre-anesthesia Checklist: Patient identified, Emergency Drugs available, Suction available and Patient being monitored Patient Re-evaluated:Patient Re-evaluated prior to induction Oxygen Delivery Method: Circle system utilized Preoxygenation: Pre-oxygenation with 100% oxygen Induction Type: IV induction Ventilation: Mask ventilation without difficulty Laryngoscope Size: McGraph and 3 Grade View: Grade I Tube type: Oral Number of attempts: 1 Airway Equipment and Method: Stylet Placement Confirmation: ETT inserted through vocal cords under direct vision, positive ETCO2 and breath sounds checked- equal and bilateral Secured at: 22 cm Tube secured with: Tape Dental Injury: Teeth and Oropharynx as per pre-operative assessment

## 2022-06-21 NOTE — Interval H&P Note (Signed)
History and Physical Interval Note:  06/21/2022 10:48 AM  Amy Peters  has presented today for surgery, with the diagnosis of Symptomatic cholelithiasis.  The various methods of treatment have been discussed with the patient and family. After consideration of risks, benefits and other options for treatment, the patient has consented to  Procedure(s): XI ROBOTIC ASSISTED LAPAROSCOPIC CHOLECYSTECTOMY (N/A) [place] (ICG) (N/A) as a surgical intervention.  The patient's history has been reviewed, patient examined, no change in status, stable for surgery.  I have reviewed the patient's chart and labs.  Questions were answered to the patient's satisfaction.     Loretha Ure

## 2022-06-21 NOTE — Transfer of Care (Addendum)
Immediate Anesthesia Transfer of Care Note  Patient: Amy Peters [facility]  Procedure(s) Performed: XI ROBOTIC ASSISTED LAPAROSCOPIC CHOLECYSTECTOMY (Abdomen) INDOCYANINE GREEN FLUORESCENCE IMAGING (ICG)  Patient Location: PACU  Anesthesia Type:General  Level of Consciousness: drowsy  Airway & Oxygen Therapy: Patient Spontanous Breathing and Patient connected to face mask oxygen  Post-op Assessment: Report given to RN and Post -op Vital signs reviewed and stable  Post vital signs: Reviewed and stable  Last Vitals:  Vitals Value Taken Time  BP    Temp    Pulse    Resp    SpO2      Last Pain:  Vitals:   06/21/22 0832  TempSrc: Temporal  PainSc: 0-No pain         Complications: No notable events documented.

## 2022-06-22 LAB — SURGICAL PATHOLOGY

## 2022-06-28 NOTE — Anesthesia Postprocedure Evaluation (Signed)
Anesthesia Post Note  Patient: Amy Peters  Procedure(s) Performed: XI ROBOTIC ASSISTED LAPAROSCOPIC CHOLECYSTECTOMY (Abdomen) INDOCYANINE GREEN FLUORESCENCE IMAGING (ICG)  Patient location during evaluation: PACU Anesthesia Type: General Level of consciousness: awake and alert Pain management: pain level controlled Vital Signs Assessment: post-procedure vital signs reviewed and stable Respiratory status: spontaneous breathing, nonlabored ventilation, respiratory function stable and patient connected to nasal cannula oxygen Cardiovascular status: blood pressure returned to baseline and stable Postop Assessment: no apparent nausea or vomiting Anesthetic complications: no   No notable events documented.   Last Vitals:  Vitals:   06/21/22 1340 06/21/22 1359  BP:  (!) 110/59  Pulse: (!) 53 67  Resp: 16 15  Temp: (!) 36.1 C (!) 36.2 C  SpO2: 98% 95%    Last Pain:  Vitals:   06/21/22 1359  TempSrc: Temporal  PainSc: 0-No pain                 Martha Clan

## 2022-07-05 ENCOUNTER — Encounter: Payer: BC Managed Care – PPO | Admitting: Physician Assistant

## 2022-07-07 ENCOUNTER — Ambulatory Visit (INDEPENDENT_AMBULATORY_CARE_PROVIDER_SITE_OTHER): Payer: BC Managed Care – PPO | Admitting: Physician Assistant

## 2022-07-07 ENCOUNTER — Encounter: Payer: Self-pay | Admitting: Physician Assistant

## 2022-07-07 VITALS — BP 126/79 | HR 68 | Temp 98.5°F | Wt 231.2 lb

## 2022-07-07 DIAGNOSIS — Z09 Encounter for follow-up examination after completed treatment for conditions other than malignant neoplasm: Secondary | ICD-10-CM

## 2022-07-07 DIAGNOSIS — K8020 Calculus of gallbladder without cholecystitis without obstruction: Secondary | ICD-10-CM

## 2022-07-07 DIAGNOSIS — Z1211 Encounter for screening for malignant neoplasm of colon: Secondary | ICD-10-CM

## 2022-07-07 NOTE — Progress Notes (Signed)
[facility] POST-OP OFFICE VISIT  07/07/2022  HPI: Amy Peters is a 51 y.o. female 16 days s/p robotic assisted laparoscopic cholecystectomy for symptomatic cholelithiasis with Dr Hampton Abbot  She is doing well Abdominal pain for about the first week; only used ibuprofen No fever, chills, nausea, emesis, nor diarrhea No issues with incisions Ambulating well   Vital signs: BP 126/79   Pulse 68   Temp 98.5 F (36.9 C) (Oral)   Wt 231 lb 3.2 oz (104.9 kg)   SpO2 97%   BMI 37.60 kg/m    Physical Exam: Constitutional: Well appearing female, NAD Abdomen: Soft, non-tender, non-distended, no rebound/guarding Skin: Laparoscopic incisions are healing well, no erythema or drainage   Assessment/Plan: This is a 51 y.o. female 16 days s/p robotic assisted laparoscopic cholecystectomy for symptomatic cholelithiasis   - Pain control prn  - Reviewed wound care recommendation  - Reviewed lifting restrictions; 4 weeks total  - Reviewed surgical pathology; [place]  - She can follow up on as needed basis; She understands to call with questions/concerns  Of note, she did mention she is in need for screening colonoscopy. She will lose PCP in December of this year. Ill be happy to provide her a referral to GI to obtain screening colonoscopy   -- Edison Simon, PA-C [facility] 07/07/2022, 4:02 PM M-F: 7am - 4pm

## 2022-07-07 NOTE — Patient Instructions (Addendum)
Referral placed to [facility]. Someone from their office will contact you to schedule an appointment.   If you do not hear from their office in 3-5 days please call the number below to schedule an office visit .   GENERAL POST-OPERATIVE PATIENT INSTRUCTIONS   WOUND CARE INSTRUCTIONS:  Keep a dry clean dressing on the wound if there is drainage. The initial bandage may be removed after 24 hours.  Once the wound has quit draining you may leave it open to air.  If clothing rubs against the wound or causes irritation and the wound is not draining you may cover it with a dry dressing during the daytime.  Try to keep the wound dry and avoid ointments on the wound unless directed to do so.  If the wound becomes bright red and painful or starts to drain infected material that is not clear, please contact your physician immediately.  If the wound is mildly pink and has a thick firm ridge underneath it, this is normal, and is referred to as a healing ridge.  This will resolve over the next 4-6 weeks.  BATHING: You may shower if you have been informed of this by your surgeon. However, Please do not submerge in a tub, hot tub, or pool until incisions are completely sealed or have been told by your surgeon that you may do so.  DIET:  You may eat any foods that you can tolerate.  It is a good idea to eat a high fiber diet and take in plenty of fluids to prevent constipation.  If you do become constipated you may want to take a mild laxative or take ducolax tablets on a daily basis until your bowel habits are regular.  Constipation can be very uncomfortable, along with straining, after recent surgery.  ACTIVITY:  You are encouraged to cough and deep breath or use your incentive spirometer if you were given one, every 15-30 minutes when awake.  This will help prevent respiratory complications and low grade fevers post-operatively if you had a general anesthetic.  You may want to hug a pillow when  coughing and sneezing to add additional support to the surgical area, if you had abdominal or chest surgery, which will decrease pain during these times.  You are encouraged to walk and engage in light activity for the next two weeks.  You should not lift more than 20 pounds for 6 weeks total after surgery as it could put you at increased risk for complications.  Twenty pounds is roughly equivalent to a plastic bag of groceries. At that time- Listen to your body when lifting, if you have pain when lifting, stop and then try again in a few days. Soreness after doing exercises or activities of daily living is normal as you get back in to your normal routine.  MEDICATIONS:  Try to take narcotic medications and anti-inflammatory medications, such as tylenol, ibuprofen, naprosyn, etc., with food.  This will minimize stomach upset from the medication.  Should you develop nausea and vomiting from the pain medication, or develop a rash, please discontinue the medication and contact your physician.  You should not drive, make important decisions, or operate machinery when taking narcotic pain medication.  SUNBLOCK Use sun block to incision area over the next year if this area will be exposed to sun. This helps decrease scarring and will allow you avoid a permanent darkened area over your incision.  QUESTIONS:  Please feel free to call our office if you have  any questions, and we will be glad to assist you. [phone]

## 2022-07-08 ENCOUNTER — Telehealth: Payer: Self-pay | Admitting: *Deleted

## 2022-07-08 ENCOUNTER — Other Ambulatory Visit: Payer: Self-pay | Admitting: *Deleted

## 2022-07-08 DIAGNOSIS — Z1211 Encounter for screening for malignant neoplasm of colon: Secondary | ICD-10-CM

## 2022-07-08 MED ORDER — NA SULFATE-K SULFATE-MG SULF 17.5-3.13-1.6 GM/177ML PO SOLN: 1.0000 | Freq: Once | ORAL | 0 refills | Status: AC

## 2022-07-08 NOTE — Telephone Encounter (Signed)
Gastroenterology Pre-Procedure Review  Request Date: 07/26/2022 Requesting Physician: Dr. Vicente Males  PATIENT REVIEW QUESTIONS: The patient responded to the following health history questions as indicated:    1. Are you having any GI issues? no 2. Do you have a personal history of Polyps? no 3. Do you have a family history of Colon Cancer or Polyps?yes( 4. Diabetes Mellitus? no 5. Joint replacements in the past 12 months?no 6. Major health problems in the past 3 months?no 7. Any artificial heart valves, MVP, or defibrillator?no    MEDICATIONS & ALLERGIES:    Patient reports the following regarding taking any anticoagulation/antiplatelet therapy:   Plavix, Coumadin, Eliquis, Xarelto, Lovenox, Pradaxa, Brilinta, or Effient? no Aspirin? no  Patient confirms/reports the following medications:  Current Outpatient Medications  Medication Sig Dispense Refill   acetaminophen (TYLENOL) 500 MG tablet Take 2 tablets (1,000 mg total) by mouth every 6 (six) hours as needed for mild pain.     acyclovir (ZOVIRAX) 800 MG tablet Take 800 mg by mouth as needed.     calcium carbonate (TUMS - DOSED IN MG ELEMENTAL CALCIUM) 500 MG chewable tablet Chew 1 tablet by mouth as needed for indigestion or heartburn.     escitalopram (LEXAPRO) 20 MG tablet Take 20 mg by mouth every morning.     ibuprofen (ADVIL) 800 MG tablet Take 1 tablet (800 mg total) by mouth every 8 (eight) hours as needed for moderate pain. 60 tablet 1   levonorgestrel (MIRENA) 20 MCG/24HR IUD 1 each by Intrauterine route once.     levothyroxine (SYNTHROID, LEVOTHROID) 125 MCG tablet Take 1 tablet (125 mcg total) by mouth daily before breakfast. 90 tablet 0   No current facility-administered medications for this visit.    Patient confirms/reports the following allergies:  No Known Allergies  No orders of the defined types were placed in this encounter.   AUTHORIZATION INFORMATION Primary Insurance: 1D#: Group #:  Secondary  Insurance: 1D#: Group #:  SCHEDULE INFORMATION: Date: 07/26/2022 Time: Location: [facility]

## 2022-07-22 ENCOUNTER — Ambulatory Visit
Admission: RE | Admit: 2022-07-22 | Discharge: 2022-07-22 | Disposition: A | Discharge status: Home / Self Care | Payer: BC Managed Care – PPO | Source: Ambulatory Visit | Attending: Obstetrics & Gynecology | Admitting: Obstetrics & Gynecology

## 2022-07-22 DIAGNOSIS — Z1231 Encounter for screening mammogram for malignant neoplasm of breast: Secondary | ICD-10-CM | POA: Diagnosis not present

## 2022-07-25 ENCOUNTER — Encounter: Payer: Self-pay | Admitting: Obstetrics & Gynecology

## 2022-07-26 ENCOUNTER — Ambulatory Visit
Admission: RE | Admit: 2022-07-26 | Discharge: 2022-07-26 | Disposition: A | Discharge status: Home / Self Care | Payer: BC Managed Care – PPO | Attending: Gastroenterology | Admitting: Gastroenterology

## 2022-07-26 ENCOUNTER — Encounter: Payer: Self-pay | Admitting: Gastroenterology

## 2022-07-26 ENCOUNTER — Ambulatory Visit: Payer: BC Managed Care – PPO | Admitting: Anesthesiology

## 2022-07-26 ENCOUNTER — Other Ambulatory Visit: Payer: Self-pay | Admitting: Obstetrics & Gynecology

## 2022-07-26 ENCOUNTER — Encounter
Admission: RE | Disposition: A | Discharge status: Home / Self Care | Payer: Self-pay | Source: Home / Self Care | Attending: Gastroenterology

## 2022-07-26 ENCOUNTER — Other Ambulatory Visit: Payer: Self-pay

## 2022-07-26 DIAGNOSIS — Z1211 Encounter for screening for malignant neoplasm of colon: Secondary | ICD-10-CM | POA: Diagnosis present

## 2022-07-26 DIAGNOSIS — E039 Hypothyroidism, unspecified: Secondary | ICD-10-CM | POA: Insufficient documentation

## 2022-07-26 DIAGNOSIS — Z8759 Personal history of other complications of pregnancy, childbirth and the puerperium: Secondary | ICD-10-CM | POA: Insufficient documentation

## 2022-07-26 DIAGNOSIS — Z87891 Personal history of nicotine dependence: Secondary | ICD-10-CM | POA: Diagnosis not present

## 2022-07-26 DIAGNOSIS — K219 Gastro-esophageal reflux disease without esophagitis: Secondary | ICD-10-CM | POA: Diagnosis not present

## 2022-07-26 DIAGNOSIS — R921 Mammographic calcification found on diagnostic imaging of breast: Secondary | ICD-10-CM

## 2022-07-26 DIAGNOSIS — R928 Other abnormal and inconclusive findings on diagnostic imaging of breast: Secondary | ICD-10-CM

## 2022-07-26 DIAGNOSIS — K5730 Diverticulosis of large intestine without perforation or abscess without bleeding: Secondary | ICD-10-CM | POA: Insufficient documentation

## 2022-07-26 HISTORY — PX: COLONOSCOPY WITH PROPOFOL: SHX5780

## 2022-07-26 SURGERY — COLONOSCOPY WITH PROPOFOL
Anesthesia: General

## 2022-07-26 MED ORDER — PROPOFOL 500 MG/50ML IV EMUL
INTRAVENOUS | Status: DC | PRN
  Administered 2022-07-26: 200 ug/kg/min via INTRAVENOUS

## 2022-07-26 MED ORDER — PHENYLEPHRINE HCL (PRESSORS) 10 MG/ML IV SOLN
INTRAVENOUS | Status: DC | PRN
  Administered 2022-07-26: 80 ug via INTRAVENOUS

## 2022-07-26 MED ORDER — SODIUM CHLORIDE 0.9 % IV SOLN: INTRAVENOUS | Status: DC

## 2022-07-26 MED ORDER — PROPOFOL 10 MG/ML IV BOLUS
INTRAVENOUS | Status: DC | PRN
  Administered 2022-07-26: 70 mg via INTRAVENOUS

## 2022-07-26 NOTE — H&P (Signed)
Amy Bellows, MD [address], [place], [place], [place], [address] [address], [place], [place], [place], [address] Phone: [phone]  Fax: [phone]  Primary Care Physician:  Overton Mam, MD   Pre-Procedure History & Physical: HPI:  Amy Peters is a 51 y.o. female is here for an colonoscopy.   Past Medical History:  Diagnosis Date   Anemia    ONLY with pregnancy   Anxiety    Breast fibroadenoma    LEFT   Bronchitis    Depression    GERD (gastroesophageal reflux disease)    HSV-1 infection    Hypothyroid 2005   IUFD (intrauterine fetal death)    @ 11/19/2022 WEEKS   Melanoma in situ ([place]) 01/08/2020   L upper arm above antecubital   Personal history of tobacco use, presenting hazards to health     Past Surgical History:  Procedure Laterality Date   BREAST EXCISIONAL BIOPSY Left 07/13/2012   neg   BREAST EXCISIONAL BIOPSY Left 2011   neg   BREAST SURGERY Left 2009, 2013   excision left breast lesion   [place], 2002   HYSTEROSCOPY  2007   HYST., D&C/POLYP   INTRAUTERINE DEVICE (IUD) INSERTION     mirena removed & mirena insertion 12-22-16   INTRAUTERINE DEVICE INSERTION  11/2011   Mirena   PILONIDAL CYST EXCISION  1994   RADIOACTIVE IODINE ABLATION  12/05/2003   OF YPPJKDT/26 MILLICURIES OF Z-124-PY. BALAN    Prior to Admission medications   Medication Sig Start Date End Date Taking? Authorizing Provider  acetaminophen (TYLENOL) 500 MG tablet Take 2 tablets (1,000 mg total) by mouth every 6 (six) hours as needed for mild pain. 06/21/22  Yes Piscoya, Jacqulyn Bath, MD  calcium carbonate (TUMS - DOSED IN MG ELEMENTAL CALCIUM) 500 MG chewable tablet Chew 1 tablet by mouth as needed for indigestion or heartburn.   Yes [provider]  escitalopram (LEXAPRO) 20 MG tablet Take 20 mg by mouth every morning. 07/06/21  Yes [provider]  ibuprofen  (ADVIL) 800 MG tablet Take 1 tablet (800 mg total) by mouth every 8 (eight) hours as needed for moderate pain. 06/21/22  Yes Piscoya, Jacqulyn Bath, MD  levothyroxine (SYNTHROID, LEVOTHROID) 125 MCG tablet Take 1 tablet (125 mcg total) by mouth daily before breakfast. 09/11/17  Yes Princess Bruins, MD  acyclovir (ZOVIRAX) 800 MG tablet Take 800 mg by mouth as needed. Patient not taking: Reported on 07/26/2022 04/27/21   [provider]  levonorgestrel (MIRENA) 20 MCG/24HR IUD 1 each by Intrauterine route once.    [provider]    Allergies as of 07/08/2022   (No Known Allergies)    Family History  Problem Relation Age of Onset   Breast cancer Mother 72       01/2010   Hypertension Father    Cancer Maternal Grandfather        colon    Social History   Socioeconomic History   Marital status: Married    Spouse name: Not on file   Number of children: Not on file   Years of education: Not on file   Highest education level: Not on file  Occupational History   Not on file  Tobacco Use   Smoking status: Former    Packs/day: 0.50    Years: 15.00    Total pack years: 7.50  Types: Cigarettes    Quit date: 05/12/2017    Years since quitting: 5.2   Smokeless tobacco: Never  Vaping Use   Vaping Use: Never used  Substance and Sexual Activity   Alcohol use: Yes    Comment: occ   Drug use: No   Sexual activity: Yes    Partners: Male    Birth control/protection: I.U.D.    Comment: mirena  Other Topics Concern   Not on file  Social History Narrative   Not on file   Social Determinants of Health   Financial Resource Strain: Not on file  Food Insecurity: Not on file  Transportation Needs: Not on file  Physical Activity: Not on file  Stress: Not on file  Social Connections: Not on file  Intimate Partner Violence: Not on file    Review of Systems: See HPI, otherwise negative ROS  Physical Exam: BP 120/76   Pulse 62   Temp (!) 96.3 F (35.7 C) (Temporal)    Resp 16   Ht '5\' 6"'$  (1.676 m)   Wt 104.3 kg   SpO2 99%   BMI 37.12 kg/m  General:   Alert,  pleasant and cooperative in NAD Head:  Normocephalic and atraumatic. Neck:  Supple; no masses or thyromegaly. Lungs:  Clear throughout to auscultation, normal respiratory effort.    Heart:  +S1, +S2, Regular rate and rhythm, No edema. Abdomen:  Soft, nontender and nondistended. Normal bowel sounds, without guarding, and without rebound.   Neurologic:  Alert and  oriented x4;  grossly normal neurologically.  Impression/Plan: TARRA PENCE is here for an colonoscopy to be performed for Screening colonoscopy average risk   Risks, benefits, limitations, and alternatives regarding  colonoscopy have been reviewed with the patient.  Questions have been answered.  All parties agreeable.   Amy Bellows, MD  07/26/2022, 8:09 AM

## 2022-07-26 NOTE — Op Note (Signed)
[facility] Gastroenterology Patient Name: Amy Peters Procedure Date: 07/26/2022 8:10 AM MRN: [id number] Account #: [id number] Date of Birth: 07-Jul-1971 Admit Type: Outpatient Age: 51 Room: [facility] ENDO ROOM 2 Gender: Female Note Status: Finalized Instrument Name: Jasper Riling [id number] Procedure:             Colonoscopy Indications:           Screening for colorectal malignant neoplasm Providers:             [name] MD, MD Medicines:             Monitored Anesthesia Care Complications:         No immediate complications. Procedure:             Pre-Anesthesia Assessment:                        - Prior to the procedure, a History and Physical was                         performed, and patient medications, allergies and                         sensitivities were reviewed. The patient's tolerance                         of previous anesthesia was reviewed.                        - The risks and benefits of the procedure and the                         sedation options and risks were discussed with the                         patient. All questions were answered and informed                         consent was obtained.                        - ASA Grade Assessment: II - A patient with mild                         systemic disease.                        After obtaining informed consent, the colonoscope was                         passed under direct vision. Throughout the procedure,                         the patient's blood pressure, pulse, and oxygen                         saturations were monitored continuously. The                         Colonoscope was introduced through the anus and  advanced to the the cecum, identified by the                         appendiceal orifice. The colonoscopy was performed                         with ease. The patient tolerated the procedure well.                         The quality of the bowel  preparation was excellent.                         The appendiceal orifice was photographed. Findings:      The perianal and digital rectal examinations were normal.      The entire examined colon appeared normal on direct and retroflexion       views.      Multiple [Opening] diverticula were found in the sigmoid colon. Impression:            - The entire examined colon is normal on direct and                         retroflexion views.                        - No specimens collected. Recommendation:        - Discharge patient to home (with escort).                        - Advance diet as tolerated.                        - Continue present medications.                        - Repeat colonoscopy in 10 years for screening                         purposes. Procedure Code(s):     --- Professional ---                        [phone], Colonoscopy, flexible; diagnostic, including                         collection of specimen(s) by brushing or washing, when                         performed (separate procedure) Diagnosis Code(s):     --- Professional ---                        Z12.11, Encounter for screening for malignant neoplasm                         of colon CPT copyright 2022 American Medical Association. All rights reserved. The codes documented in this report are preliminary and upon coder review may  be revised to meet current compliance requirements. [name], MD [name] MD, MD 07/26/2022 8:33:58 AM This report has been signed electronically. Number of Addenda: 0 Note Initiated On: 07/26/2022 8:10 AM  Scope Withdrawal Time: 0 hours 12 minutes 51 seconds  Total Procedure Duration: 0 hours 15 minutes 43 seconds  Estimated Blood Loss:  Estimated blood loss: none.      [facility]

## 2022-07-26 NOTE — Anesthesia Preprocedure Evaluation (Signed)
Anesthesia Evaluation  Patient identified by MRN, date of birth, ID band Patient awake    Reviewed: Allergy & Precautions, NPO status , Patient's Chart, lab work & pertinent test results  History of Anesthesia Complications Negative for: history of anesthetic complications  Airway Mallampati: III  TM Distance: >3 FB Neck ROM: full    Dental  (+) Chipped, Dental Advidsory Given, Teeth Intact   Pulmonary neg shortness of breath, neg COPD, neg recent URI, former smoker   Pulmonary exam normal        Cardiovascular Exercise Tolerance: Good (-) angina (-) Past MI negative cardio ROS Normal cardiovascular exam     Neuro/Psych  PSYCHIATRIC DISORDERS Anxiety Depression    negative neurological ROS     GI/Hepatic Neg liver ROS,GERD  Controlled,,  Endo/Other  neg diabetesHypothyroidism    Renal/GU negative Renal ROS     Musculoskeletal   Abdominal   Peds  Hematology negative hematology ROS (+)   Anesthesia Other Findings Past Medical History: No date: Anemia     Comment:  ONLY with pregnancy No date: Anxiety No date: Breast fibroadenoma     Comment:  LEFT No date: Bronchitis No date: Depression No date: GERD (gastroesophageal reflux disease) 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 2005: Hypothyroid No date: IUFD (intrauterine fetal death)     Comment:  @ 58 WEEKS 01/08/2020: Melanoma in situ ([place])     Comment:  L upper arm above antecubital No date: Personal history of tobacco use, presenting hazards to health  Past Surgical History: 07/13/2012: BREAST EXCISIONAL BIOPSY; Left     Comment:  neg 2011: BREAST EXCISIONAL BIOPSY; Left     Comment:  neg 2009, 2013: BREAST SURGERY; Left     Comment:  excision left breast lesion 1996: [place], 2002: [place] OF UTERUS 2007: HYSTEROSCOPY     Comment:  HYST., D&C/POLYP No date: INTRAUTERINE DEVICE (IUD) INSERTION     Comment:  mirena  removed & mirena insertion 12-22-16 11/2011: INTRAUTERINE DEVICE INSERTION     Comment:  Mirena 1994: PILONIDAL CYST EXCISION 12/05/2003: RADIOACTIVE IODINE ABLATION     Comment:  OF TMHDQQI/29 MILLICURIES OF N-989-QJ. BALAN  BMI    Body Mass Index: 38.04 kg/m      Reproductive/Obstetrics negative OB ROS                             Anesthesia Physical Anesthesia Plan  ASA: 3  Anesthesia Plan: General   Post-op Pain Management:    Induction: Intravenous  PONV Risk Score and Plan: Treatment may vary due to age or medical condition, Propofol infusion and TIVA  Airway Management Planned: Natural Airway and Nasal Cannula  Additional Equipment:   Intra-op Plan:   Post-operative Plan:   Informed Consent: I have reviewed the patients History and Physical, chart, labs and discussed the procedure including the risks, benefits and alternatives for the proposed anesthesia with the patient or authorized representative who has indicated his/her understanding and acceptance.     Dental Advisory Given  Plan Discussed with: Anesthesiologist, CRNA and Surgeon  Anesthesia Plan Comments: (Patient consented for risks of anesthesia including but not limited to:  - adverse reactions to medications - damage to eyes, teeth, lips or other oral mucosa - nerve damage due to positioning  - sore throat or hoarseness - Damage to heart, brain, nerves, lungs, other parts of body or loss of life  Patient voiced understanding.)  Anesthesia Quick Evaluation  

## 2022-07-26 NOTE — Anesthesia Procedure Notes (Signed)
Date/Time: 07/26/2022 8:20 AM  Performed by: Nelda Marseille, CRNAPre-anesthesia Checklist: Patient identified, Emergency Drugs available, Suction available, Patient being monitored and Timeout performed Oxygen Delivery Method: Nasal cannula

## 2022-07-26 NOTE — Transfer of Care (Signed)
Immediate Anesthesia Transfer of Care Note  Patient: Amy Peters  Procedure(s) Performed: COLONOSCOPY WITH PROPOFOL  Patient Location: PACU  Anesthesia Type:General  Level of Consciousness: awake and sedated  Airway & Oxygen Therapy: Patient Spontanous Breathing and Patient connected to nasal cannula oxygen  Post-op Assessment: Report given to RN and Post -op Vital signs reviewed and stable  Post vital signs: Reviewed and stable  Last Vitals:  Vitals Value Taken Time  BP 114/69 07/26/22 0837  Temp    Pulse 62 07/26/22 0837  Resp 22 07/26/22 0837  SpO2 100 % 07/26/22 0837    Last Pain:  Vitals:   07/26/22 0739  TempSrc: Temporal  PainSc: 0-No pain         Complications: No notable events documented.

## 2022-07-27 ENCOUNTER — Ambulatory Visit
Admission: RE | Admit: 2022-07-27 | Discharge: 2022-07-27 | Disposition: A | Discharge status: Home / Self Care | Payer: BC Managed Care – PPO | Source: Ambulatory Visit | Attending: Obstetrics & Gynecology | Admitting: Obstetrics & Gynecology

## 2022-07-27 ENCOUNTER — Encounter: Payer: Self-pay | Admitting: Gastroenterology

## 2022-07-27 ENCOUNTER — Other Ambulatory Visit: Payer: Self-pay | Admitting: Obstetrics & Gynecology

## 2022-07-27 DIAGNOSIS — R928 Other abnormal and inconclusive findings on diagnostic imaging of breast: Secondary | ICD-10-CM

## 2022-07-27 DIAGNOSIS — R921 Mammographic calcification found on diagnostic imaging of breast: Secondary | ICD-10-CM | POA: Insufficient documentation

## 2022-07-28 NOTE — Anesthesia Postprocedure Evaluation (Signed)
Anesthesia Post Note  Patient: Amy Peters  Procedure(s) Performed: COLONOSCOPY WITH PROPOFOL  Patient location during evaluation: Endoscopy Anesthesia Type: General Level of consciousness: awake and alert Pain management: pain level controlled Vital Signs Assessment: post-procedure vital signs reviewed and stable Respiratory status: spontaneous breathing, nonlabored ventilation, respiratory function stable and patient connected to nasal cannula oxygen Cardiovascular status: blood pressure returned to baseline and stable Postop Assessment: no apparent nausea or vomiting Anesthetic complications: no   No notable events documented.   Last Vitals:  Vitals:   07/26/22 0846 07/26/22 0856  BP: 114/67 115/76  Pulse: 61 62  Resp: 19 20  Temp:    SpO2: 100% 100%    Last Pain:  Vitals:   07/27/22 0754  TempSrc:   PainSc: 0-No pain                 Martha Clan

## 2022-08-09 ENCOUNTER — Ambulatory Visit
Admission: RE | Admit: 2022-08-09 | Discharge: 2022-08-09 | Disposition: A | Discharge status: Home / Self Care | Payer: BC Managed Care – PPO | Source: Ambulatory Visit | Attending: Obstetrics & Gynecology | Admitting: Obstetrics & Gynecology

## 2022-08-09 DIAGNOSIS — R928 Other abnormal and inconclusive findings on diagnostic imaging of breast: Secondary | ICD-10-CM

## 2022-08-09 DIAGNOSIS — R921 Mammographic calcification found on diagnostic imaging of breast: Secondary | ICD-10-CM

## 2022-08-09 HISTORY — PX: BREAST BIOPSY: SHX20

## 2022-08-09 MED ORDER — LIDOCAINE HCL (PF) 1 % IJ SOLN
5.0000 mL | Freq: Once | INTRAMUSCULAR | Status: DC
  Filled 2022-08-09: qty 5

## 2022-08-09 MED ORDER — LIDOCAINE-EPINEPHRINE 1 %-1:100000 IJ SOLN
10.0000 mL | Freq: Once | INTRAMUSCULAR | Status: DC
  Filled 2022-08-09: qty 10

## 2022-08-10 LAB — SURGICAL PATHOLOGY

## 2023-07-04 ENCOUNTER — Other Ambulatory Visit: Payer: Self-pay | Admitting: Family Medicine

## 2023-07-04 DIAGNOSIS — Z1231 Encounter for screening mammogram for malignant neoplasm of breast: Secondary | ICD-10-CM

## 2023-07-27 ENCOUNTER — Ambulatory Visit
Admission: RE | Admit: 2023-07-27 | Discharge: 2023-07-27 | Disposition: A | Discharge status: Home / Self Care | Payer: 59 | Source: Ambulatory Visit | Attending: Family Medicine | Admitting: Family Medicine

## 2023-07-27 DIAGNOSIS — Z1231 Encounter for screening mammogram for malignant neoplasm of breast: Secondary | ICD-10-CM | POA: Diagnosis present

## 2023-11-29 NOTE — Patient Instructions (Signed)
 Preventive Care 16-52 Years Old, Female  Preventive care refers to lifestyle choices and visits with your health care provider that can promote health and wellness. Preventive care visits are also called wellness exams.  What can I expect for my preventive care visit?  Counseling  Your health care provider may ask you questions about your:  Medical history, including:  Past medical problems.  Family medical history.  Pregnancy history.  Current health, including:  Menstrual cycle.  Method of birth control.  Emotional well-being.  Home life and relationship well-being.  Sexual activity and sexual health.  Lifestyle, including:  Alcohol, nicotine or tobacco, and drug use.  Access to firearms.  Diet, exercise, and sleep habits.  Work and work Astronomer.  Sunscreen use.  Safety issues such as seatbelt and bike helmet use.  Physical exam  Your health care provider will check your:  Height and weight. These may be used to calculate your BMI (body mass index). BMI is a measurement that tells if you are at a healthy weight.  Waist circumference. This measures the distance around your waistline. This measurement also tells if you are at a healthy weight and may help predict your risk of certain diseases, such as type 2 diabetes and high blood pressure.  Heart rate and blood pressure.  Body temperature.  Skin for abnormal spots.  What immunizations do I need?    Vaccines are usually given at various ages, according to a schedule. Your health care provider will recommend vaccines for you based on your age, medical history, and lifestyle or other factors, such as travel or where you work.  What tests do I need?  Screening  Your health care provider may recommend screening tests for certain conditions. This may include:  Lipid and cholesterol levels.  Diabetes screening. This is done by checking your blood sugar (glucose) after you have not eaten for a while (fasting).  Pelvic exam and Pap test.  Hepatitis B test.  Hepatitis C  test.  HIV (human immunodeficiency virus) test.  STI (sexually transmitted infection) testing, if you are at risk.  Lung cancer screening.  Colorectal cancer screening.  Mammogram. Talk with your health care provider about when you should start having regular mammograms. This may depend on whether you have a family history of breast cancer.  BRCA-related cancer screening. This may be done if you have a family history of breast, ovarian, tubal, or peritoneal cancers.  Bone density scan. This is done to screen for osteoporosis.  Talk with your health care provider about your test results, treatment options, and if necessary, the need for more tests.  Follow these instructions at home:  Eating and drinking    Eat a diet that includes fresh fruits and vegetables, whole grains, lean protein, and low-fat dairy products.  Take vitamin and mineral supplements as recommended by your health care provider.  Do not drink alcohol if:  Your health care provider tells you not to drink.  You are pregnant, may be pregnant, or are planning to become pregnant.  If you drink alcohol:  Limit how much you have to 0-1 drink a day.  Know how much alcohol is in your drink. In the [place] one drink equals one 12 oz bottle of beer (355 mL), one 5 oz glass of wine (148 mL), or one 1 oz glass of hard liquor (44 mL).  Lifestyle  Brush your teeth every morning and night with fluoride toothpaste. Floss one time each day.  Exercise for at least  30 minutes 5 or more days each week.  Do not use any products that contain nicotine or tobacco. These products include cigarettes, chewing tobacco, and vaping devices, such as e-cigarettes. If you need help quitting, ask your health care provider.  Do not use drugs.  If you are sexually active, practice safe sex. Use a condom or other form of protection to prevent STIs.  If you do not wish to become pregnant, use a form of birth control. If you plan to become pregnant, see your health care provider for a  prepregnancy visit.  Take aspirin only as told by your health care provider. Make sure that you understand how much to take and what form to take. Work with your health care provider to find out whether it is safe and beneficial for you to take aspirin daily.  Find healthy ways to manage stress, such as:  Meditation, yoga, or listening to music.  Journaling.  Talking to a trusted person.  Spending time with friends and family.  Minimize exposure to UV radiation to reduce your risk of skin cancer.  Safety  Always wear your seat belt while driving or riding in a vehicle.  Do not drive:  If you have been drinking alcohol. Do not ride with someone who has been drinking.  When you are tired or distracted.  While texting.  If you have been using any mind-altering substances or drugs.  Wear a helmet and other protective equipment during sports activities.  If you have firearms in your house, make sure you follow all gun safety procedures.  Seek help if you have been physically or sexually abused.  What's next?  Visit your health care provider once a year for an annual wellness visit.  Ask your health care provider how often you should have your eyes and teeth checked.  Stay up to date on all vaccines.  This information is not intended to replace advice given to you by your health care provider. Make sure you discuss any questions you have with your health care provider.  Document Revised: 02/03/2021 Document Reviewed: 02/03/2021  Elsevier Patient Education  2024 [employer].

## 2023-11-29 NOTE — Progress Notes (Unsigned)
 GYNECOLOGY: ANNUAL EXAM   Subjective:    PCP: Perfecto Kingdom, MD Amy Peters is a 53 y.o. female [phone] who presents for annual wellness visit.   Well Woman Visit:  GYN HISTORY:  No LMP recorded. (Menstrual status: IUD).     Menstrual History: OB History     Gravida  4   Para  2   Term  1   Preterm  1   AB  2   Living  2      SAB  2   IAB      Ectopic      Multiple      Live Births  2        Obstetric Comments  Age first menstrual cycle 82 Age first pregnancy 77 IUD 2008         Menarche age: *** No LMP recorded. (Menstrual status: IUD).     Periods are every *** days, and last *** days, flow is light / moderate / heavy.  Uses pads / tampons / menstrual cup and changes it every *** hours.  Cramping is mild / moderate / severe.  Cyclic symptoms include: {symptoms; gyn cyclic:13153}.  Intermenstrual bleeding, spotting, or discharge? *** Urinary incontinence? ***  Sexually active: *** Number of sexual partners: *** Gender of sexual Partners: *** Social History   Substance and Sexual Activity  Sexual Activity Yes   Partners: Male   Birth control/protection: I.U.D.   Comment: mirena   Contraceptive methods: {PLAN CONTRACEPTION:313102} Dyspareunia? *** STI history: *** STI/HIV testing or immunizations needed? {yes [id number]   Health Maintenance: -Last pap: approximate date 09/10/21 and was normal  --> Any abnormals: *** -Last mammogram: {mammo results:48038} --> Any abnormals? *** -Last colon cancer screen: *** / Type: *** -Last DEXA scan: *** [facility] of Breast / Colon / Cervical cancer: *** -Vaccines:  Immunization History  Administered Date(s) Administered   Influenza,inj,Quad PF,6+ Mos 06/06/2014, 06/01/2018   PFIZER Comirnaty(Gray Top)Covid-19 Tri-Sucrose Vaccine 09/19/2019, 10/11/2019, 05/29/2020   Tdap 04/12/2013   Last Tdap: *** / Flu: *** / COVID: *** / Gardasil: *** / Shingles (50+): *** / PCV20:  -Hep C screen:  *** -Last lipid / glucose screening: ***  > Exercise: {misc; exercise types:16438}, {exercise level:31265} > Dietary Supplements: Folate: {yes/no:20286};  Calcium: {yes/no:20286}}; Vitamin D: {yes/no:20286} > There is no height or weight on file to calculate BMI.  > Recent dental visit {yes no:314532} > Seat Belt Use: {yes no:314532} > Texting and driving? {yes no:314532} > Guns in the house {yes no:314532} > Recreational or other drug use: {Drug Use:32241}   Social History   Tobacco Use   Smoking status: Former    Current packs/day: 0.00    Average packs/day: 0.5 packs/day for 15.0 years (7.5 ttl pk-yrs)    Types: Cigarettes    Start date: 05/12/2002    Quit date: 05/12/2017    Years since quitting: 6.5   Smokeless tobacco: Never  Substance Use Topics   Alcohol use: Yes    Comment: occ   Occupation: ***    Lives with: ***    PHQ-2 Score: In last two weeks, how often have you felt: Little interest or pleasure in doing things: {PHQGADfrequency:29690::"Not at all (0)"} Feeling down, depressed or hopeless: {PHQGADfrequency:29690::"Not at all (0)"} Score:   GAD-2 Over the last 2 weeks, how often have you been bothered by the following problems? Feeling nervous, anxious or on edge: {PHQGADfrequency:29690::"Not at all (0)"} Not being able to stop or control worrying: {  PHQGADfrequency:29690::"Not at all (0)"}} Score: _________________________________________________________  Current Outpatient Medications  Medication Sig Dispense Refill   acetaminophen (TYLENOL) 500 MG tablet Take 2 tablets (1,000 mg total) by mouth every 6 (six) hours as needed for mild pain.     acyclovir (ZOVIRAX) 800 MG tablet Take 800 mg by mouth as needed. (Patient not taking: Reported on 07/26/2022)     calcium carbonate (TUMS - DOSED IN MG ELEMENTAL CALCIUM) 500 MG chewable tablet Chew 1 tablet by mouth as needed for indigestion or heartburn.     escitalopram (LEXAPRO) 20 MG tablet Take 20 mg by mouth  every morning.     ibuprofen (ADVIL) 800 MG tablet Take 1 tablet (800 mg total) by mouth every 8 (eight) hours as needed for moderate pain. 60 tablet 1   levonorgestrel (MIRENA) 20 MCG/24HR IUD 1 each by Intrauterine route once.     levothyroxine (SYNTHROID, LEVOTHROID) 125 MCG tablet Take 1 tablet (125 mcg total) by mouth daily before breakfast. 90 tablet 0   No current facility-administered medications for this visit.   No Known Allergies  Past Medical History:  Diagnosis Date   Anemia    ONLY with pregnancy   Anxiety    Breast fibroadenoma    LEFT   Bronchitis    Depression    GERD (gastroesophageal reflux disease)    HSV-1 infection    Hypothyroid 2005   IUFD (intrauterine fetal death)    @ 2024/01/07 WEEKS   Melanoma in situ (HCC) 01/08/2020   L upper arm above antecubital   Personal history of tobacco use, presenting hazards to health    Past Surgical History:  Procedure Laterality Date   BREAST BIOPSY Left 08/09/2022   calcifications  x clip/ benign   BREAST BIOPSY Left 08/09/2022   MM LT BREAST BX W LOC DEV 1ST LESION IMAGE BX SPEC STEREO GUIDE 08/09/2022 [facility]   BREAST EXCISIONAL BIOPSY Left 07/13/2012   neg   BREAST EXCISIONAL BIOPSY Left 2011   neg   BREAST SURGERY Left 2009, 2013   excision left breast lesion   CHOLECYSTECTOMY     COLONOSCOPY WITH PROPOFOL N/A 07/26/2022   Procedure: COLONOSCOPY WITH PROPOFOL;  Surgeon: Wyline Mood, MD;  Location: [facility] ENDOSCOPY;  Service: Gastroenterology;  Laterality: N/A;   DILATION AND CURETTAGE OF UTERUS  1996   DILATION AND CURETTAGE OF UTERUS  1996, 2002   HYSTEROSCOPY  2007   HYST., D&C/POLYP   INTRAUTERINE DEVICE (IUD) INSERTION     mirena removed & mirena insertion 12-22-16   INTRAUTERINE DEVICE INSERTION  11/2011   Mirena   PILONIDAL CYST EXCISION  1994   RADIOACTIVE IODINE ABLATION  12/05/2003   OF THYROID/20 MILLICURIES OF I-131-DR. BALAN    Review Of Systems  Constitutional: Denied constitutional  symptoms, night sweats, recent illness, fatigue, fever, insomnia and weight loss.  Eyes: Denied eye symptoms, eye pain, photophobia, vision change and visual disturbance.  Ears/Nose/Throat/Neck: Denied ear, nose, throat or neck symptoms, hearing loss, nasal discharge, sinus congestion and sore throat.  Cardiovascular: Denied cardiovascular symptoms, arrhythmia, chest pain/pressure, edema, exercise intolerance, orthopnea and palpitations.  Respiratory: Denied pulmonary symptoms, asthma, pleuritic pain, productive sputum, cough, dyspnea and wheezing.  Gastrointestinal: Denied, gastro-esophageal reflux, melena, nausea and vomiting.  Genitourinary:*** Denied genitourinary symptoms including symptomatic vaginal discharge, pelvic relaxation issues, and urinary complaints.  Musculoskeletal: Denied musculoskeletal symptoms, stiffness, swelling, muscle weakness and myalgia.  Dermatologic: Denied dermatology symptoms, rash and scar.  Neurologic: Denied neurology symptoms, dizziness, headache, neck pain and syncope.  Psychiatric: Denied psychiatric symptoms, anxiety and depression.  Endocrine: Denied endocrine symptoms including hot flashes and night sweats.      Objective:    There were no vitals taken for this visit.  Constitutional: Well-developed, well-nourished female in no acute distress Neurological: Alert and oriented to person, place, and time Psychiatric: Mood and affect appropriate Skin: No rashes or lesions Neck: Supple without masses. Trachea is midline.Thyroid is normal size without masses Lymphatics: No cervical, axillary, supraclavicular, or inguinal adenopathy noted Respiratory: Clear to auscultation bilaterally. Good air movement with normal work of breathing. Cardiovascular: Regular rate and rhythm. Extremities grossly normal, nontender with no edema; pulses regular Gastrointestinal: Soft, nontender, nondistended. No masses or hernias appreciated. No hepatosplenomegaly. No fluid  wave. No rebound or guarding. Breast Exam: {Exam; breast:13139::"normal appearance, no masses or tenderness"} Genitourinary:         External Genitalia: Normal female genitalia    Vagina: Normal mucosa, no lesions.    Cervix: No lesions, normal size and consistency; no cervical motion tenderness; non-friable; Pap not***obtained.    Uterus: Normal size and contour; smooth, mobile, NT, {Desc; anteverted/retroverted/midposition:60613}. Adnexae: Non-palpable and non-tender Perineum/Anus: No lesions Rectal: deferred    Assessment/Plan:    Amy Peters is a 53 y.o. female [phone] with normal well-woman gynecologic exam.  -Screenings:  Pap: done with cotesting today  *** w/rflx today Mammogram: ordered***due *** Colon: ordered colonoscopy***Cologuard -OR- due *** Labs: ***A1C, CMP, HepC, Lipid panel, Vit D, TSH GAD***PHQ-2 = ***, discussed coping techniques; follow up with PCP if worsens or develops concern -Contraception: *** -Vaccines: UTD, Tdap today; pt declines Influenza. Gardasil: series not started, declined today.  -Healthy lifestyle modifications discussed: multivitamin, diet, exercise, sunscreen, tobacco and alcohol use. Emphasized importance of regular physical activity.  -Folate***Calcium and Vit D recommendation reviewed.  -All questions answered to patient's satisfaction.  -RTC 1 yr for annual, sooner prn.   No follow-ups on file.    Julieanne Manson, DO [facility] at [facility].mrn

## 2023-11-30 ENCOUNTER — Encounter: Payer: Self-pay | Admitting: Obstetrics

## 2023-11-30 ENCOUNTER — Ambulatory Visit (INDEPENDENT_AMBULATORY_CARE_PROVIDER_SITE_OTHER): Admitting: Obstetrics

## 2023-11-30 VITALS — BP 124/75 | HR 59 | Ht 66.0 in | Wt 243.0 lb

## 2023-11-30 DIAGNOSIS — Z6839 Body mass index (BMI) 39.0-39.9, adult: Secondary | ICD-10-CM | POA: Insufficient documentation

## 2023-11-30 DIAGNOSIS — Z01419 Encounter for gynecological examination (general) (routine) without abnormal findings: Secondary | ICD-10-CM

## 2023-11-30 DIAGNOSIS — N911 Secondary amenorrhea: Secondary | ICD-10-CM | POA: Insufficient documentation

## 2023-11-30 DIAGNOSIS — Z1231 Encounter for screening mammogram for malignant neoplasm of breast: Secondary | ICD-10-CM

## 2023-12-01 LAB — ESTRADIOL: Estradiol: 15.1 pg/mL

## 2023-12-01 LAB — FOLLICLE STIMULATING HORMONE: FSH: 56.9 m[IU]/mL

## 2023-12-06 ENCOUNTER — Telehealth: Payer: Self-pay | Admitting: Obstetrics

## 2023-12-06 NOTE — Telephone Encounter (Signed)
 Spoke with pt via phone re: labs results; based on [facility] level and possibility of continued menses, pt desires IUD replacement. Notified office to reach out and schedule.

## 2023-12-29 ENCOUNTER — Ambulatory Visit: Admitting: Family Medicine

## 2024-01-30 ENCOUNTER — Ambulatory Visit: Admitting: Family Medicine

## 2024-07-10 ENCOUNTER — Ambulatory Visit (INDEPENDENT_AMBULATORY_CARE_PROVIDER_SITE_OTHER)

## 2024-07-10 DIAGNOSIS — Z85820 Personal history of malignant melanoma of skin: Secondary | ICD-10-CM | POA: Diagnosis not present

## 2024-07-10 DIAGNOSIS — R21 Rash and other nonspecific skin eruption: Secondary | ICD-10-CM

## 2024-07-10 DIAGNOSIS — L820 Inflamed seborrheic keratosis: Secondary | ICD-10-CM

## 2024-07-10 DIAGNOSIS — L814 Other melanin hyperpigmentation: Secondary | ICD-10-CM | POA: Diagnosis not present

## 2024-07-10 MED ORDER — MUPIROCIN 2 % EX OINT: TOPICAL_OINTMENT | CUTANEOUS | 1 refills | Status: AC

## 2024-07-10 NOTE — Patient Instructions (Signed)

## 2024-07-10 NOTE — Progress Notes (Signed)
    Subjective   Amy Peters is a 53 y.o. female who presents for the following: Lesion(s) of concern . Patient is new patient  Today patient reports: Area of concern on the face. Irritated spot under nose for several weeks   Review of Systems:    History of melanoma ROS - denies any fevers, chills, weight loss, night sweats, lymphadenopathy, nausea, vomiting, diarrhea, chest pain, shortness of breath, headaches, changes in vision  .  The following portions of the chart were reviewed this encounter and updated as appropriate: medications, allergies, medical history  Relevant Medical History:  Personal history of melanoma - see medical history for full details   Objective  (SKPE) Well appearing patient in no apparent distress; mood and affect are within normal limits. Examination was performed of the: Focused Exam of: Face   Examination notable for: Nasal columella and bilateral nares with erythematous eroded papule   Right Lower Leg - Posterior Stuck on waxy paps with erythema  Lentigo R arm   Assessment & Plan  (SKAP)   Eroded pink papules around nares  - Swabbed for aerobe/anaerobe - Start mupirocin  2% ointment BID to crusted areas until clear  - if does not resolve w above recommended bx   Seborrheic keratosis, inflamed, R leg  Lentigo R arm  - Discussed diagnosis, typical course, and treatment options for this condition - Reassurance, benign, monitor - ABCDE's discussed - Given irritation and symptoms, will proceed with cryotherapy as below  History of melanoma - recommended FBSE in next few months   Patient instructions (SKPI)   Procedures, orders, diagnosis for this visit:  RASH AND OTHER NONSPECIFIC SKIN ERUPTION   Related Procedures Anaerobic and Aerobic Culture INFLAMED SEBORRHEIC KERATOSIS Right Lower Leg - Posterior Symptomatic, irritating, patient would like treated. Destruction of lesion - Right Lower Leg - Posterior Complexity: simple    Destruction method: cryotherapy   Informed consent: discussed and consent obtained   Timeout:  patient name, date of birth, surgical site, and procedure verified Lesion destroyed using liquid nitrogen: Yes   Region frozen until ice ball extended beyond lesion: Yes   Cryo cycles: 1 or 2. Outcome: patient tolerated procedure well with no complications   Post-procedure details: wound care instructions given     Rash and other nonspecific skin eruption -     Anaerobic and Aerobic Culture  Inflamed seborrheic keratosis -     Destruction of lesion  Other orders -     Mupirocin ; Apply to affected area of skin three times daily until healed  Dispense: 30 g; Refill: 1    Return to clinic: Return for TBSE.  I, Emerick Ege, CMA am acting as scribe for Lauraine JAYSON Kanaris, MD.   Documentation: I have reviewed the above documentation for accuracy and completeness, and I agree with the above.  Lauraine JAYSON Kanaris, MD

## 2024-07-18 LAB — ANAEROBIC AND AEROBIC CULTURE

## 2024-07-23 ENCOUNTER — Ambulatory Visit: Payer: Self-pay

## 2024-07-23 NOTE — Progress Notes (Signed)
 Patient is informed and states lesion is improving.

## 2024-08-05 ENCOUNTER — Encounter

## 2024-08-05 ENCOUNTER — Ambulatory Visit
Admission: RE | Admit: 2024-08-05 | Discharge: 2024-08-05 | Disposition: A | Source: Ambulatory Visit | Attending: Obstetrics

## 2024-08-05 DIAGNOSIS — Z1231 Encounter for screening mammogram for malignant neoplasm of breast: Secondary | ICD-10-CM | POA: Diagnosis present

## 2024-08-29 ENCOUNTER — Ambulatory Visit
